# Patient Record
Sex: Female | Born: 1960 | Race: White | Hispanic: No | Marital: Married | Smoking: Former smoker
Health system: Southern US, Community
[De-identification: ages and names within clinical notes are randomized; demographics above are authoritative.]

## PROBLEM LIST (undated history)

## (undated) DIAGNOSIS — L309 Dermatitis, unspecified: Secondary | ICD-10-CM

## (undated) DIAGNOSIS — F32A Depression, unspecified: Secondary | ICD-10-CM

## (undated) DIAGNOSIS — F329 Major depressive disorder, single episode, unspecified: Secondary | ICD-10-CM

## (undated) DIAGNOSIS — R002 Palpitations: Secondary | ICD-10-CM

## (undated) HISTORY — PX: CHOLECYSTECTOMY: SHX55

## (undated) HISTORY — DX: Palpitations: R00.2

## (undated) HISTORY — PX: ANKLE SURGERY: SHX546

## (undated) HISTORY — PX: TUBAL LIGATION: SHX77

## (undated) HISTORY — DX: Depression, unspecified: F32.A

## (undated) HISTORY — DX: Dermatitis, unspecified: L30.9

## (undated) HISTORY — DX: Major depressive disorder, single episode, unspecified: F32.9

---

## 1999-10-15 ENCOUNTER — Ambulatory Visit (HOSPITAL_COMMUNITY): Admission: RE | Admit: 1999-10-15 | Discharge: 1999-10-15 | Payer: Self-pay | Admitting: Pulmonary Disease

## 1999-10-15 ENCOUNTER — Encounter: Payer: Self-pay | Admitting: Pulmonary Disease

## 1999-10-28 ENCOUNTER — Ambulatory Visit (HOSPITAL_COMMUNITY): Admission: RE | Admit: 1999-10-28 | Discharge: 1999-10-29 | Payer: Self-pay | Admitting: General Surgery

## 1999-10-28 ENCOUNTER — Encounter (INDEPENDENT_AMBULATORY_CARE_PROVIDER_SITE_OTHER): Payer: Self-pay | Admitting: *Deleted

## 2006-04-08 ENCOUNTER — Encounter (HOSPITAL_BASED_OUTPATIENT_CLINIC_OR_DEPARTMENT_OTHER): Payer: Self-pay | Admitting: General Surgery

## 2007-10-18 ENCOUNTER — Encounter: Admission: RE | Admit: 2007-10-18 | Discharge: 2007-10-18 | Payer: Self-pay | Admitting: Obstetrics and Gynecology

## 2008-10-20 ENCOUNTER — Encounter: Admission: RE | Admit: 2008-10-20 | Discharge: 2008-10-20 | Payer: Self-pay | Admitting: Obstetrics and Gynecology

## 2009-08-31 ENCOUNTER — Emergency Department (HOSPITAL_BASED_OUTPATIENT_CLINIC_OR_DEPARTMENT_OTHER): Admission: EM | Admit: 2009-08-31 | Discharge: 2009-08-31 | Payer: Self-pay | Admitting: Emergency Medicine

## 2009-11-08 ENCOUNTER — Ambulatory Visit (HOSPITAL_BASED_OUTPATIENT_CLINIC_OR_DEPARTMENT_OTHER): Admission: RE | Admit: 2009-11-08 | Discharge: 2009-11-08 | Payer: Self-pay | Admitting: Obstetrics and Gynecology

## 2009-11-08 ENCOUNTER — Ambulatory Visit: Payer: Self-pay | Admitting: Diagnostic Radiology

## 2010-11-26 ENCOUNTER — Ambulatory Visit (HOSPITAL_BASED_OUTPATIENT_CLINIC_OR_DEPARTMENT_OTHER)
Admission: RE | Admit: 2010-11-26 | Discharge: 2010-11-26 | Payer: Self-pay | Source: Home / Self Care | Attending: Obstetrics and Gynecology | Admitting: Obstetrics and Gynecology

## 2012-01-23 ENCOUNTER — Other Ambulatory Visit: Payer: Self-pay | Admitting: Obstetrics and Gynecology

## 2012-01-23 DIAGNOSIS — Z1231 Encounter for screening mammogram for malignant neoplasm of breast: Secondary | ICD-10-CM

## 2012-01-26 ENCOUNTER — Ambulatory Visit (HOSPITAL_BASED_OUTPATIENT_CLINIC_OR_DEPARTMENT_OTHER)
Admission: RE | Admit: 2012-01-26 | Discharge: 2012-01-26 | Disposition: A | Discharge status: Home / Self Care | Payer: 59 | Source: Ambulatory Visit | Attending: Obstetrics and Gynecology | Admitting: Obstetrics and Gynecology

## 2012-01-26 DIAGNOSIS — Z1231 Encounter for screening mammogram for malignant neoplasm of breast: Secondary | ICD-10-CM

## 2012-06-21 ENCOUNTER — Other Ambulatory Visit (HOSPITAL_COMMUNITY)
Admission: RE | Admit: 2012-06-21 | Discharge: 2012-06-21 | Disposition: A | Discharge status: Home / Self Care | Payer: 59 | Source: Ambulatory Visit | Attending: Obstetrics and Gynecology | Admitting: Obstetrics and Gynecology

## 2012-06-21 DIAGNOSIS — Z124 Encounter for screening for malignant neoplasm of cervix: Secondary | ICD-10-CM | POA: Insufficient documentation

## 2012-10-08 HISTORY — PX: OTHER SURGICAL HISTORY: SHX169

## 2013-03-14 ENCOUNTER — Other Ambulatory Visit: Payer: Self-pay | Admitting: Obstetrics and Gynecology

## 2013-03-14 DIAGNOSIS — Z1231 Encounter for screening mammogram for malignant neoplasm of breast: Secondary | ICD-10-CM

## 2013-03-17 ENCOUNTER — Ambulatory Visit (HOSPITAL_BASED_OUTPATIENT_CLINIC_OR_DEPARTMENT_OTHER)
Admission: RE | Admit: 2013-03-17 | Discharge: 2013-03-17 | Disposition: A | Discharge status: Home / Self Care | Payer: 59 | Source: Ambulatory Visit | Attending: Obstetrics and Gynecology | Admitting: Obstetrics and Gynecology

## 2013-03-17 DIAGNOSIS — Z1231 Encounter for screening mammogram for malignant neoplasm of breast: Secondary | ICD-10-CM | POA: Insufficient documentation

## 2014-02-06 ENCOUNTER — Ambulatory Visit: Payer: 59 | Admitting: Internal Medicine

## 2014-02-07 ENCOUNTER — Encounter: Payer: Self-pay | Admitting: Internal Medicine

## 2014-02-07 ENCOUNTER — Ambulatory Visit (INDEPENDENT_AMBULATORY_CARE_PROVIDER_SITE_OTHER): Payer: 59 | Admitting: Internal Medicine

## 2014-02-07 VITALS — BP 120/90 | HR 69 | Ht 65.0 in | Wt 165.1 lb

## 2014-02-07 DIAGNOSIS — R0609 Other forms of dyspnea: Secondary | ICD-10-CM

## 2014-02-07 DIAGNOSIS — R002 Palpitations: Secondary | ICD-10-CM

## 2014-02-07 DIAGNOSIS — R0989 Other specified symptoms and signs involving the circulatory and respiratory systems: Secondary | ICD-10-CM

## 2014-02-07 DIAGNOSIS — R0600 Dyspnea, unspecified: Secondary | ICD-10-CM

## 2014-02-07 DIAGNOSIS — E785 Hyperlipidemia, unspecified: Secondary | ICD-10-CM

## 2014-02-07 DIAGNOSIS — Z79899 Other long term (current) drug therapy: Secondary | ICD-10-CM

## 2014-02-07 MED ORDER — ATORVASTATIN CALCIUM 20 MG PO TABS: 20.0000 mg | ORAL_TABLET | Freq: Every day | ORAL | Status: DC

## 2014-02-07 MED ORDER — METOPROLOL SUCCINATE ER 25 MG PO TB24: 12.5000 mg | ORAL_TABLET | Freq: Every day | ORAL | Status: DC

## 2014-02-07 NOTE — Patient Instructions (Addendum)
Dr Debara Pickett has ordered a cardiometabolic test - this is done in our office on Mondays. PLEASE TAKE TOPROL THE DAY OF TEST.  What is a Cardiopulmonary Exercise Test (CPET)?   The Cardiopulmonary Exercise Test is a highly sensitive, non-invasive stress test. It is considered a stress test because the exercise stresses your body's systems by making them work faster and harder. A disease or condition that affects the heart, lungs or muscles will limit how much faster and harder these systems can work. A CPET assesses how well the heart, lungs, and muscles are working individually, and how these systems are working in unison. Your heart and lungs work together to deliver oxygen to your muscles, where it is used to make energy, and to remove carbon dioxide from your body.  The full cardiopulmonary system is assessed during a CPET by measuring the amount of oxygen your body is using, the amount of carbon dioxide it is producing, your breathing pattern, and electrocardiogram (EKG) while you are riding a stationary bicycle.  The traditional treadmill stress test only relies on the EKG, which only partially assesses the heart and nothing else. Besides detecting problems in multiple body systems, the CPET is also used to monitor changes in your disease condition, the effect of certain medications on your body, and if medical therapy is improving your condition.  What conditions can be detected/monitored by the Cardiopulmonary ExerciseTest?  Heart, lung, and metabolic conditions may cause shortness of breath, exercise intolerance or discomfort and pain in the chest. The CPET is the only test that can simultaneously determine which of these systems is causing the problem   Your physician has recommended you make the following change in your medication: INCREASE atorvastatin to 65m once daily. START Toprol XL 12.540monce daily.   Your physician recommends that you return for lab work in: 3 months. Fasting -  nothing to eat/drink after midnight.   Please schedule a follow up visit with Dr. HiDebara Pickettfter your MET test.

## 2014-02-07 NOTE — Progress Notes (Signed)
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 Recently has had some fluttering and underwent a 7-day monitor which showed no evidence of extrasystoles. In fact, she had 2 episodes of heart racing which correlated with a sinus rhythm at a rate of about 70. She did have some mild sinus tachycardia and heart rate varied between 50 and 130 over the week of the study. No significant abnormalities were noted. I also had her undergo metabolic testing due to occasional exertional chest pressure. She did very well with that MET-test that was performed on October 18, 2012, showed a normal exercise effort with a peak VO2 of 94%. Max predicted heart rate was slightly higher at 89% with a marked inflection after anaerobic threshold. There was a mild ischemic response with flattening of the VO2 curve; however, this is still a low risk study and I suspect could relate to microvascular dysfunction or some other abnormality at high rates of exercise. Overall, the treatment for this is increasing her exercise. I have given her exercise prescription for better cardiovascular conditioning. One could consider beta blockade; however, I do not think there is a clear indication and her chest fluttering has improved. Finally, we talked about addition of either CoQ10 or L-Arginine to her diet, both of which may be helpful in improving some of her ischemic dysfunction.  Alyssa Warner returns today for followup. She reports that recently she's been exercising and has noted some increasing shortness of breath with exertion. She says that she had cholesterol testing done in January of 2015. I have results of that which indicate total cholesterol 279, triglycerides 57, HDL 70 and LDL 198. Based on this she was started on low-dose Lipitor 10 mg  daily. She denies any true chest pain.  PMHx:  Past Medical History  Diagnosis Date  . Depression     History reviewed. No pertinent past surgical history.  FAMHx:  Family History  Problem Relation Age of Onset  . Breast cancer Mother   . Hypertension Father     SOCHx:   reports that she quit smoking about 30 years ago. She has never used smokeless tobacco. She reports that she drinks alcohol. She reports that she does not use illicit drugs.  ALLERGIES:  No Known Allergies  ROS: A comprehensive review of systems was negative except for: Respiratory: positive for dyspnea on exertion Cardiovascular: positive for palpitations  HOME MEDS: Current Outpatient Prescriptions  Medication Sig Dispense Refill  . atorvastatin (LIPITOR) 20 MG tablet Take 1 tablet (20 mg total) by mouth daily.  30 tablet  6  . CALCIUM-VITAMIN A-VITAMIN D PO Take 1,000 mg by mouth daily.      . CO ENZYME Q-10 PO Take by mouth daily.      Marland Kitchen FLUoxetine (PROZAC) 20 MG capsule Take 20 mg by mouth daily.      . L-ARGININE PO Take by mouth 4 (four) times daily.      . metoprolol succinate (TOPROL-XL) 25 MG 24 hr tablet Take 0.5 tablets (12.5 mg total) by mouth daily.  15 tablet  6   No current facility-administered medications for this visit.    LABS/IMAGING: No results found for this or any previous visit (from the past 48 hour(s)). No results found.  VITALS: BP 120/90  Pulse 69  Ht '5\' 5"'  (1.651 m)  Wt 165 lb 1.6 oz (74.889 kg)  BMI 27.47 kg/m2  EXAM: General appearance: alert and no distress Neck: no carotid bruit and no JVD Lungs: clear to auscultation bilaterally Heart: regular rate and rhythm, S1, S2 normal, no murmur, click, rub or gallop Abdomen: soft, non-tender; bowel sounds normal; no masses,  no organomegaly Extremities: extremities normal, atraumatic, no cyanosis or edema Pulses: 2+ and symmetric Skin: Skin color, texture, turgor normal. No rashes or lesions Neurologic: Grossly  normal Psych: Mood, affect normal  EKG: Normal sinus rhythm at 69  ASSESSMENT: 1. Dyspnea and exertion 2. Palpitations 3. Dyslipidemia-on Lipitor  PLAN: 1.   Alyssa Warner continues to have symptoms that are similar to what she's experienced in the past, including palpitations. Her monitor was fairly unrevealing, but she is known to have had PVCs. I suspect there could be small vessel ischemia causing her abnormal ischemic response with metabolic testing. Overall her last stress test was low risk in 2013. Given her symptoms with the palpitations, would recommend starting low-dose beta blocker 12.5 mg of Toprol-XL daily. I would also recommend a repeat met test while on beta blocker to see how this affects her VO2 curve.  Finally, with respect to her dyslipidemia, her cholesterol target is less than 100 for the LDL and she will not reach that on 10 mg of Lipitor. She needs at least a 50% reduction in her cholesterol. I understand the hesitate approach of using a low-dose statin to try to minimize muscle aches, however it is now recommended that we treat the target. I would therefore increase her Lipitor to 20 mg daily which should get her close to goal if she can make more dietary changes. She should continue on L. arginine and coenzyme Q10 300 mg daily, which may be also helpful in reducing statin myalgias.  Plan to see her back in a few weeks to discuss results of her met test and to see if her symptoms have improved.  Pixie Casino, MD, [facility] Attending Cardiologist [facility]  Alyssa Warner C 02/07/2014, 4:35 PM

## 2014-02-16 ENCOUNTER — Encounter: Payer: Self-pay | Admitting: Internal Medicine

## 2014-02-23 ENCOUNTER — Ambulatory Visit: Payer: 59 | Admitting: Internal Medicine

## 2014-03-02 ENCOUNTER — Encounter: Payer: Self-pay | Admitting: *Deleted

## 2014-03-08 ENCOUNTER — Ambulatory Visit: Payer: 59 | Admitting: Internal Medicine

## 2014-03-13 DIAGNOSIS — R0602 Shortness of breath: Secondary | ICD-10-CM

## 2014-03-13 DIAGNOSIS — R002 Palpitations: Secondary | ICD-10-CM

## 2014-03-13 DIAGNOSIS — R0600 Dyspnea, unspecified: Secondary | ICD-10-CM

## 2014-03-14 ENCOUNTER — Ambulatory Visit: Payer: 59 | Admitting: Internal Medicine

## 2014-04-04 ENCOUNTER — Other Ambulatory Visit: Payer: Self-pay | Admitting: Obstetrics and Gynecology

## 2014-04-04 DIAGNOSIS — Z1231 Encounter for screening mammogram for malignant neoplasm of breast: Secondary | ICD-10-CM

## 2014-04-06 ENCOUNTER — Ambulatory Visit (INDEPENDENT_AMBULATORY_CARE_PROVIDER_SITE_OTHER): Payer: 59 | Admitting: Internal Medicine

## 2014-04-06 ENCOUNTER — Encounter: Payer: Self-pay | Admitting: Internal Medicine

## 2014-04-06 VITALS — BP 98/60 | HR 62 | Ht 65.0 in | Wt 162.3 lb

## 2014-04-06 DIAGNOSIS — E785 Hyperlipidemia, unspecified: Secondary | ICD-10-CM

## 2014-04-06 DIAGNOSIS — R0609 Other forms of dyspnea: Secondary | ICD-10-CM

## 2014-04-06 DIAGNOSIS — R002 Palpitations: Secondary | ICD-10-CM

## 2014-04-06 DIAGNOSIS — R0989 Other specified symptoms and signs involving the circulatory and respiratory systems: Secondary | ICD-10-CM

## 2014-04-06 NOTE — Progress Notes (Signed)
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 Recently has had some fluttering and underwent a 7-day monitor which showed no evidence of extrasystoles. In fact, she had 2 episodes of heart racing which correlated with a sinus rhythm at a rate of about 70. She did have some mild sinus tachycardia and heart rate varied between 50 and 130 over the week of the study. No significant abnormalities were noted. I also had her undergo metabolic testing due to occasional exertional chest pressure. She did very well with that MET-test that was performed on October 18, 2012, showed a normal exercise effort with a peak VO2 of 94%. Max predicted heart rate was slightly higher at 89% with a marked inflection after anaerobic threshold. There was a mild ischemic response with flattening of the VO2 curve; however, this is still a low risk study and I suspect could relate to microvascular dysfunction or some other abnormality at high rates of exercise. Overall, the treatment for this is increasing her exercise. I have given her exercise prescription for better cardiovascular conditioning. One could consider beta blockade; however, I do not think there is a clear indication and her chest fluttering has improved. Finally, we talked about addition of either CoQ10 or L-Arginine to her diet, both of which may be helpful in improving some of her ischemic dysfunction.  Alyssa Warner returns today for followup. She reports that recently she's been exercising and has noted some increasing shortness of breath with exertion. She says that she had cholesterol testing done in January of 2015. I have results of that which indicate total cholesterol 279, triglycerides 57, HDL 70 and LDL 198. Based on this she was started on low-dose Lipitor 10 mg  daily. She denies any true chest pain.  She reported still having problems with palpitations. And there was some shortness of breath with exercise. At her last visit I recommended starting low-dose Toprol. She seems to take that medication without any problems. She underwent repeat metabolic testing. This demonstrated improved exercise tolerance with a stable VO2 of 91%.  PMHx:  Past Medical History  Diagnosis Date  . Depression   . Heart palpitations     Past Surgical History  Procedure Laterality Date  . Met test  10/2012    low risk study; peak VO2 94% predicted; ischemic myocardial dysfunction     FAMHx:  Family History  Problem Relation Age of Onset  . Breast cancer Mother   . Kidney disease Mother   . Hypertension Father   . Arrhythmia Father   . Heart disease Father   . Arrhythmia Brother     pacemaker    SOCHx:   reports that she quit smoking about 30 years ago. She has never used smokeless tobacco. She reports that she drinks alcohol. She reports that she does not use illicit drugs.  ALLERGIES:  No Known Allergies  ROS: A comprehensive review of systems was negative except for: Respiratory: positive for dyspnea on exertion Cardiovascular: positive for palpitations  HOME MEDS: Current Outpatient Prescriptions  Medication Sig Dispense Refill  . atorvastatin (LIPITOR) 20 MG tablet Take 1 tablet (20 mg total) by mouth daily.  30 tablet  6  . CALCIUM-VITAMIN A-VITAMIN D PO Take 1,000 mg by mouth daily.      Marland Kitchen  CO ENZYME Q-10 PO Take by mouth daily.      Marland Kitchen FLUoxetine (PROZAC) 20 MG capsule Take 20 mg by mouth daily.      . L-ARGININE PO Take by mouth 4 (four) times daily.      . metoprolol succinate (TOPROL-XL) 25 MG 24 hr tablet Take 0.5 tablets (12.5 mg total) by mouth daily.  15 tablet  6   No current facility-administered medications for this visit.    LABS/IMAGING: No results found for this or any previous visit (from the past 48 hour(s)). No results  found.  VITALS: BP 98/60  Pulse 62  Ht 5' 5" (1.651 m)  Wt 162 lb 4.8 oz (73.619 kg)  BMI 27.01 kg/m2  EXAM: deferred  EKG: deferred  ASSESSMENT: 1. Dyspnea and exertion - improved performance on MET testing 2. Palpitations - improved 3. Dyslipidemia-on Lipitor  PLAN: 1.   Alyssa Warner has had improvements in her palpitations with low-dose beta blocker. She's also start her walking and I believe this is helping her as well. Her blood pressure is low normal today but she has not been symptomatic with this. I think she still going to be able to tolerate low-dose beta blocker. I recommend continuing her current medications. She's due for recheck of her cholesterol to see if she's had improvement in her dyslipidemia on Lipitor sometime in June. I'll contact her with the results of that study. Otherwise plan to see her back in 6 months to year.  Pixie Casino, MD, [facility] Attending Cardiologist [place] 04/06/2014, 5:21 PM

## 2014-04-06 NOTE — Patient Instructions (Signed)
Your physician recommends that you return for lab work in June - fasting.   Your physician wants you to follow-up in: 6 months with Dr. Rennis GoldenHilty. You will receive a reminder letter in the mail two months in advance. If you don't receive a letter, please call our office to schedule the follow-up appointment.

## 2014-04-10 ENCOUNTER — Ambulatory Visit (HOSPITAL_BASED_OUTPATIENT_CLINIC_OR_DEPARTMENT_OTHER)
Admission: RE | Admit: 2014-04-10 | Discharge: 2014-04-10 | Disposition: A | Discharge status: Home / Self Care | Payer: 59 | Source: Ambulatory Visit | Attending: Obstetrics and Gynecology | Admitting: Obstetrics and Gynecology

## 2014-04-10 DIAGNOSIS — Z1231 Encounter for screening mammogram for malignant neoplasm of breast: Secondary | ICD-10-CM

## 2014-04-11 ENCOUNTER — Other Ambulatory Visit: Payer: Self-pay | Admitting: *Deleted

## 2014-04-11 MED ORDER — ATORVASTATIN CALCIUM 20 MG PO TABS: 20.0000 mg | ORAL_TABLET | Freq: Every day | ORAL | Status: DC

## 2014-04-11 NOTE — Telephone Encounter (Signed)
Rx refill sent to patient pharmacy for a 90 day supply in order for coverage.

## 2014-05-10 ENCOUNTER — Other Ambulatory Visit: Payer: Self-pay

## 2014-05-10 MED ORDER — METOPROLOL SUCCINATE ER 25 MG PO TB24: 12.5000 mg | ORAL_TABLET | Freq: Every day | ORAL | Status: DC

## 2014-05-10 NOTE — Telephone Encounter (Signed)
Rx was sent to pharmacy electronically. 

## 2014-05-16 ENCOUNTER — Telehealth: Payer: Self-pay | Admitting: Internal Medicine

## 2014-05-16 LAB — COMPREHENSIVE METABOLIC PANEL
ALBUMIN: 4.3 g/dL (ref 3.5–5.2)
ALT: 21 U/L (ref 0–35)
AST: 20 U/L (ref 0–37)
Alkaline Phosphatase: 71 U/L (ref 39–117)
BUN: 15 mg/dL (ref 6–23)
CALCIUM: 9.3 mg/dL (ref 8.4–10.5)
CO2: 28 mEq/L (ref 19–32)
Chloride: 101 mEq/L (ref 96–112)
Creat: 0.85 mg/dL (ref 0.50–1.10)
Glucose, Bld: 87 mg/dL (ref 70–99)
POTASSIUM: 4.2 meq/L (ref 3.5–5.3)
Sodium: 139 mEq/L (ref 135–145)
TOTAL PROTEIN: 7.1 g/dL (ref 6.0–8.3)
Total Bilirubin: 0.6 mg/dL (ref 0.2–1.2)

## 2014-05-16 NOTE — Telephone Encounter (Signed)
RN returned call to patient. Advised her of where lab is and that she did not need the physical lab papers to take to get blood work. She will get labs today.

## 2014-05-16 NOTE — Telephone Encounter (Signed)
Pt lost her lab order. Would you please fax it to [phone] and also what lab does she need to go?

## 2014-05-17 ENCOUNTER — Encounter: Payer: Self-pay | Admitting: *Deleted

## 2014-05-17 LAB — NMR LIPOPROFILE WITH LIPIDS
Cholesterol, Total: 169 mg/dL (ref <200)
HDL Particle Number: 40.6 umol/L (ref >=30.5)
HDL Size: 9.8 nm (ref >=9.2)
HDL-C: 75 mg/dL (ref >=40)
LDL (calc): 76 mg/dL (ref <100)
LDL PARTICLE NUMBER: 647 nmol/L (ref <1000)
LDL Size: 21.8 nm (ref >20.5)
LP-IR Score: 27 (ref <=45)
Large HDL-P: 13.1 umol/L (ref >=4.8)
Large VLDL-P: 3.6 nmol/L — ABNORMAL HIGH (ref <=2.7)
Small LDL Particle Number: 199 nmol/L (ref <=527)
TRIGLYCERIDES: 92 mg/dL (ref <150)
VLDL SIZE: 48.3 nm — AB (ref <=46.6)

## 2015-01-23 ENCOUNTER — Ambulatory Visit: Payer: 59 | Admitting: Internal Medicine

## 2015-04-02 ENCOUNTER — Ambulatory Visit: Payer: 59 | Admitting: Internal Medicine

## 2015-05-10 ENCOUNTER — Encounter: Payer: Self-pay | Admitting: Internal Medicine

## 2015-05-10 ENCOUNTER — Ambulatory Visit (INDEPENDENT_AMBULATORY_CARE_PROVIDER_SITE_OTHER): Payer: 59 | Admitting: Internal Medicine

## 2015-05-10 VITALS — BP 90/64 | HR 52 | Ht 65.0 in | Wt 155.8 lb

## 2015-05-10 DIAGNOSIS — Z79899 Other long term (current) drug therapy: Secondary | ICD-10-CM

## 2015-05-10 DIAGNOSIS — R002 Palpitations: Secondary | ICD-10-CM | POA: Diagnosis not present

## 2015-05-10 DIAGNOSIS — E785 Hyperlipidemia, unspecified: Secondary | ICD-10-CM | POA: Diagnosis not present

## 2015-05-10 NOTE — Patient Instructions (Signed)
Your physician recommends that you return for lab work FASTING  Your physician wants you to follow-up in: 1 year with Dr. Hilty. You will receive a reminder letter in the mail two months in advance. If you don't receive a letter, please call our office to schedule the follow-up appointment.  

## 2015-05-10 NOTE — Progress Notes (Signed)
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 Recently has had some fluttering and underwent a 7-day monitor which showed no evidence of extrasystoles. In fact, she had 2 episodes of heart racing which correlated with a sinus rhythm at a rate of about 70. She did have some mild sinus tachycardia and heart rate varied between 50 and 130 over the week of the study. No significant abnormalities were noted. I also had her undergo metabolic testing due to occasional exertional chest pressure. She did very well with that MET-test that was performed on October 18, 2012, showed a normal exercise effort with a peak VO2 of 94%. Max predicted heart rate was slightly higher at 89% with a marked inflection after anaerobic threshold. There was a mild ischemic response with flattening of the VO2 curve; however, this is still a low risk study and I suspect could relate to microvascular dysfunction or some other abnormality at high rates of exercise. Overall, the treatment for this is increasing her exercise. I have given her exercise prescription for better cardiovascular conditioning. One could consider beta blockade; however, I do not think there is a clear indication and her chest fluttering has improved. Finally, we talked about addition of either CoQ10 or L-Arginine to her diet, both of which may be helpful in improving some of her ischemic dysfunction.  Alyssa Warner returns today for followup. She reports that recently she's been exercising and has noted some increasing shortness of breath with exertion. She says that she had cholesterol testing done in January of 2015. I have results of that which indicate total cholesterol 279, triglycerides 57, HDL 70 and LDL 198. Based on this she was started on low-dose Lipitor 10 mg daily.  She denies any true chest pain.  She reported still having problems with palpitations. And there was some shortness of breath with exercise. At her last visit I recommended starting low-dose Toprol. She seems to take that medication without any problems. She underwent repeat metabolic testing. This demonstrated improved exercise tolerance with a stable VO2 of 91%.  Alyssa Warner returns today for follow-up. Overall she is doing well. She denies any significant shortness of breath. She very occasionally gets palpitations but feels like they're well controlled on her beta blocker. Blood pressure is low today at 90/60 however she is asymptomatic. She says she rarely checks heart rate or blood pressure. She denies any presyncope, worsening fatigue or other symptoms and may be associated with low blood pressure.  PMHx:  Past Medical History  Diagnosis Date  . Depression   . Heart palpitations     Past Surgical History  Procedure Laterality Date  . Met test  10/2012    low risk study; peak VO2 94% predicted; ischemic myocardial dysfunction     FAMHx:  Family History  Problem Relation Age of Onset  . Breast cancer Mother   . Kidney disease Mother   . Hypertension Father   . Arrhythmia Father   . Heart disease Father   . Arrhythmia Brother     pacemaker    SOCHx:   reports that she quit smoking about 31 years ago. She has never used smokeless tobacco. She reports that she drinks alcohol. She reports that she does not use illicit drugs.  ALLERGIES:  No Known  Allergies  ROS: A comprehensive review of systems was negative.  HOME MEDS: Current Outpatient Prescriptions  Medication Sig Dispense Refill  . atorvastatin (LIPITOR) 20 MG tablet Take 1 tablet (20 mg total) by mouth daily. 90 tablet 2  . CALCIUM-VITAMIN A-VITAMIN D PO Take 1,000 mg by mouth daily.    . CO ENZYME Q-10 PO Take by mouth daily.    Marland Kitchen FLUoxetine (PROZAC) 20 MG capsule Take 20 mg by mouth daily.    . L-ARGININE PO  Take by mouth 4 (four) times daily.    . metoprolol succinate (TOPROL-XL) 25 MG 24 hr tablet Take 0.5 tablets (12.5 mg total) by mouth daily. 45 tablet 3   No current facility-administered medications for this visit.    LABS/IMAGING: No results found for this or any previous visit (from the past 48 hour(s)). No results found.  VITALS: BP 90/64 mmHg  Pulse 52  Ht _0  (1.651 m)  Wt 155 lb 12.8 oz (70.67 kg)  BMI 25.93 kg/m2  EXAM: General appearance: alert and no distress Neck: no carotid bruit and no JVD Lungs: clear to auscultation bilaterally Heart: regular rate and rhythm, S1, S2 normal, no murmur, click, rub or gallop Abdomen: soft, non-tender; bowel sounds normal; no masses,  no organomegaly Extremities: extremities normal, atraumatic, no cyanosis or edema Pulses: 2+ and symmetric Skin: Skin color, texture, turgor normal. No rashes or lesions Neurologic: Grossly normal Psych: NOrmal  EKG: Sinus bradycardia 52  ASSESSMENT: 1. Dyspnea and exertion - improved performance on MET testing 2. Palpitations - improved 3. Dyslipidemia-on Lipitor  PLAN: 1.   Alyssa Warner feels very well denies chest pain or shortness of breath. She remains physically active. She has very infrequent palpitations which are controlled on the Toprol. Blood pressure is a low normal today. I recommended she could liberalize salt in her diet. She is due for recheck of cholesterol issues on Lipitor. We'll go ahead and send her for a regular lipid profile. I'll contact her with those results, otherwise plan to see her back annually or sooner as necessary.  Pixie Casino, MD, [facility] Attending Cardiologist [place] 05/10/2015, 11:11 AM

## 2015-05-14 ENCOUNTER — Telehealth: Payer: Self-pay | Admitting: Internal Medicine

## 2015-05-14 MED ORDER — FLUOXETINE HCL 20 MG PO CAPS: 20.0000 mg | ORAL_CAPSULE | Freq: Every day | ORAL | Status: DC

## 2015-05-14 NOTE — Telephone Encounter (Signed)
Message routed to Dr. Rennis GoldenHilty to see if he will refill this medication for short supply

## 2015-05-14 NOTE — Telephone Encounter (Signed)
Rx(s) sent to pharmacy electronically. LM on cell that med was refilled

## 2015-05-14 NOTE — Telephone Encounter (Signed)
°  1. Which medications need to be refilled? Fluoxetine 20mg  1 tab po qd  2. Which pharmacy is medication to be sent to?[facility] on [address]  3. Do they need a 30 day or 90 day supply? 30  4. Would they like a call back once the medication has been sent to the pharmacy? Yes  * SHE STATES THAT HE GYNECOLOGIST ORIGINALLY WROTE THE PRESCRIPTION FOR THIS MEDICATION BUT SHE IS UNABLE TO LOCATE HER DOCTOR NOW AND CAN NOT GET ANOTHER APPT WITH A NEW DOCTOR FOR ANOTHER 2 WKS.

## 2015-05-14 NOTE — Telephone Encounter (Signed)
Ok to refill short supply, no-refills.  DR. HRexene Edison

## 2015-05-23 ENCOUNTER — Encounter: Payer: Self-pay | Admitting: *Deleted

## 2015-05-23 LAB — COMPREHENSIVE METABOLIC PANEL
ALBUMIN: 4 g/dL (ref 3.5–5.2)
ALT: 19 U/L (ref 0–35)
AST: 20 U/L (ref 0–37)
Alkaline Phosphatase: 83 U/L (ref 39–117)
BUN: 15 mg/dL (ref 6–23)
CALCIUM: 9.1 mg/dL (ref 8.4–10.5)
CHLORIDE: 105 meq/L (ref 96–112)
CO2: 30 mEq/L (ref 19–32)
Creat: 0.79 mg/dL (ref 0.50–1.10)
GLUCOSE: 88 mg/dL (ref 70–99)
Potassium: 4.3 mEq/L (ref 3.5–5.3)
Sodium: 142 mEq/L (ref 135–145)
Total Bilirubin: 0.5 mg/dL (ref 0.2–1.2)
Total Protein: 6.5 g/dL (ref 6.0–8.3)

## 2015-05-23 LAB — LIPID PANEL
CHOLESTEROL: 146 mg/dL (ref 0–200)
HDL: 72 mg/dL (ref >=46)
LDL CALC: 63 mg/dL (ref 0–99)
TRIGLYCERIDES: 55 mg/dL (ref <150)
Total CHOL/HDL Ratio: 2 Ratio
VLDL: 11 mg/dL (ref 0–40)

## 2015-08-08 ENCOUNTER — Other Ambulatory Visit: Payer: Self-pay | Admitting: Internal Medicine

## 2015-08-08 NOTE — Telephone Encounter (Signed)
Rx(s) sent to pharmacy electronically.  

## 2015-10-18 ENCOUNTER — Telehealth: Payer: Self-pay | Admitting: Internal Medicine

## 2015-10-18 MED ORDER — ATORVASTATIN CALCIUM 20 MG PO TABS: 20.0000 mg | ORAL_TABLET | Freq: Every day | ORAL | Status: DC

## 2015-10-18 NOTE — Telephone Encounter (Signed)
°*  STAT* If patient is at the pharmacy, call can be transferred to refill team.   1. Which medications need to be refilled? (please list name of each medication and dose if known) Atorvastatin  2. Which pharmacy/location (including street and city if local pharmacy) is medication to [facility] sent to?[phone]  3. Do they need a 30 day or 90 day supply? 90 and refills

## 2016-07-14 ENCOUNTER — Ambulatory Visit: Payer: 59 | Admitting: Internal Medicine

## 2016-07-16 ENCOUNTER — Ambulatory Visit: Payer: 59 | Admitting: Internal Medicine

## 2016-07-24 ENCOUNTER — Ambulatory Visit (INDEPENDENT_AMBULATORY_CARE_PROVIDER_SITE_OTHER): Payer: 59 | Admitting: Internal Medicine

## 2016-07-24 ENCOUNTER — Encounter: Payer: Self-pay | Admitting: Internal Medicine

## 2016-07-24 VITALS — BP 122/71 | HR 63 | Ht 65.0 in | Wt 155.4 lb

## 2016-07-24 DIAGNOSIS — E785 Hyperlipidemia, unspecified: Secondary | ICD-10-CM | POA: Diagnosis not present

## 2016-07-24 DIAGNOSIS — R0600 Dyspnea, unspecified: Secondary | ICD-10-CM | POA: Diagnosis not present

## 2016-07-24 DIAGNOSIS — R002 Palpitations: Secondary | ICD-10-CM

## 2016-07-24 NOTE — Patient Instructions (Signed)
Your physician recommends that you return for lab work FASTING to check cholesterol   Your physician wants you to follow-up in: ONE YEAR with Dr. Hilty. You will receive a reminder letter in the mail two months in advance. If you don't receive a letter, please call our office to schedule the follow-up appointment.   

## 2016-07-24 NOTE — Progress Notes (Signed)
OFFICE NOTE  Chief Complaint:  Rare palpitations  Primary Care Physician: Oliver Pila, MD  HPI:  Alyssa Warner is a 55 year-old female who was sent to me for palpitations and was found to have some PVCs many years ago. Recently has had some fluttering and underwent a 7-day monitor which showed no evidence of extrasystoles. In fact, she had 2 episodes of heart racing which correlated with a sinus rhythm at a rate of about 70. She did have some mild sinus tachycardia and heart rate varied between 50 and 130 over the week of the study. No significant abnormalities were noted. I also had her undergo metabolic testing due to occasional exertional chest pressure. She did very well with that MET-test that was performed on October 18, 2012, showed a normal exercise effort with a peak VO2 of 94%. Max predicted heart rate was slightly higher at 89% with a marked inflection after anaerobic threshold. There was a mild ischemic response with flattening of the VO2 curve; however, this is still a low risk study and I suspect could relate to microvascular dysfunction or some other abnormality at high rates of exercise. Overall, the treatment for this is increasing her exercise. I have given her exercise prescription for better cardiovascular conditioning. One could consider beta blockade; however, I do not think there is a clear indication and her chest fluttering has improved. Finally, we talked about addition of either CoQ10 or L-Arginine to her diet, both of which may be helpful in improving some of her ischemic dysfunction.  Alyssa Warner returns today for followup. She reports that recently she's been exercising and has noted some increasing shortness of breath with exertion. She says that she had cholesterol testing done in January of 2015. I have results of that which indicate total cholesterol 279, triglycerides 57, HDL 70 and LDL 198. Based on this she was started on low-dose Lipitor 10 mg  daily. She denies any true chest pain.  She reported still having problems with palpitations. And there was some shortness of breath with exercise. At her last visit I recommended starting low-dose Toprol. She seems to take that medication without any problems. She underwent repeat metabolic testing. This demonstrated improved exercise tolerance with a stable VO2 of 91%.  Alyssa Warner returns today for follow-up. Overall she is doing well. She denies any significant shortness of breath. She very occasionally gets palpitations but feels like they're well controlled on her beta blocker. Blood pressure is low today at 90/60 however she is asymptomatic. She says she rarely checks heart rate or blood pressure. She denies any presyncope, worsening fatigue or other symptoms and may be associated with low blood pressure.  07/24/2016  Alyssa Warner returns today for follow-up. She reports very infrequent palpitations that are generally well controlled. She denies any worsening shortness of breath with exertion. She has recently bought a bicycle and hurt her husband exercise regularly. They also have a beach house which they are going to be going to more regularly as well. Cholesterol is due for reassessment but was well controlled last year. She denies any chest pain.  PMHx:  Past Medical History:  Diagnosis Date  . Depression   . Heart palpitations     Past Surgical History:  Procedure Laterality Date  . MET Test  10/2012   low risk study; peak VO2 94% predicted; ischemic myocardial dysfunction     FAMHx:  Family History  Problem Relation Age of Onset  . Breast cancer Mother   .  Kidney disease Mother   . Hypertension Father   . Arrhythmia Father   . Heart disease Father   . Arrhythmia Brother     pacemaker    SOCHx:   reports that she quit smoking about 32 years ago. She has never used smokeless tobacco. She reports that she drinks alcohol. She reports that she does not use  drugs.  ALLERGIES:  No Known Allergies  ROS: Pertinent items noted in HPI and remainder of comprehensive ROS otherwise negative.  HOME MEDS: Current Outpatient Prescriptions  Medication Sig Dispense Refill  . atorvastatin (LIPITOR) 20 MG tablet Take 1 tablet (20 mg total) by mouth daily. 90 tablet 2  . CALCIUM-VITAMIN A-VITAMIN D PO Take 1,000 mg by mouth daily.    . CO ENZYME Q-10 PO Take by mouth daily.    Marland Kitchen FLUoxetine (PROZAC) 20 MG capsule Take 1 capsule (20 mg total) by mouth daily. 30 capsule 0  . L-ARGININE PO Take by mouth 4 (four) times daily.    . metoprolol succinate (TOPROL-XL) 25 MG 24 hr tablet TAKE 0.5 TABLETS (12.5 MG TOTAL) BY MOUTH DAILY. 45 tablet 2   No current facility-administered medications for this visit.     LABS/IMAGING: No results found for this or any previous visit (from the past 48 hour(s)). No results found.  VITALS: BP 122/71   Pulse 63   Ht '5\' 5"'  (1.651 m)   Wt 155 lb 6.4 oz (70.5 kg)   BMI 25.86 kg/m   EXAM: General appearance: alert and no distress Neck: no carotid bruit and no JVD Lungs: clear to auscultation bilaterally Heart: regular rate and rhythm, S1, S2 normal, no murmur, click, rub or gallop Abdomen: soft, non-tender; bowel sounds normal; no masses,  no organomegaly Extremities: extremities normal, atraumatic, no cyanosis or edema Pulses: 2+ and symmetric Skin: Skin color, texture, turgor normal. No rashes or lesions Neurologic: Grossly normal Psych: Normal  EKG: Normal sinus rhythm at 63  ASSESSMENT: 1. Dyspnea and exertion - improved 2. Palpitations - improved 3. Dyslipidemia-on Lipitor  PLAN: 1.   Mrs. Schiavi reports very rare palpitations which are well controlled. We'll continue her current dose of Toprol. She is due for repeat check of her cholesterol and I'll order that today. Her shortness of breath has improved and seems to be improving with more exercise. Plan to see her back annually or sooner as  necessary.  Pixie Casino, MD, [facility] Attending Cardiologist [place] 07/24/2016, 12:45 PM

## 2016-11-19 LAB — LIPID PANEL
Cholesterol: 202 mg/dL — ABNORMAL HIGH (ref <200)
HDL: 81 mg/dL (ref >50)
LDL CALC: 109 mg/dL — AB (ref <100)
TRIGLYCERIDES: 59 mg/dL (ref <150)
Total CHOL/HDL Ratio: 2.5 Ratio (ref <5.0)
VLDL: 12 mg/dL (ref <30)

## 2016-11-21 ENCOUNTER — Encounter: Payer: Self-pay | Admitting: Internal Medicine

## 2016-11-21 ENCOUNTER — Telehealth: Payer: Self-pay | Admitting: Internal Medicine

## 2016-11-21 DIAGNOSIS — E785 Hyperlipidemia, unspecified: Secondary | ICD-10-CM

## 2016-11-21 NOTE — Telephone Encounter (Signed)
New message ° °Pt is returning call  ° °Please call back °

## 2016-11-21 NOTE — Telephone Encounter (Signed)
Called patient and notified her of lab results. She had not been taking lipitor 20mg  on a regular basis. She states she will be more diligent about this. She will have repeat labs in 3 months. Lab slips, copy of labs, mychart sign up info mailed to patient.

## 2017-01-26 ENCOUNTER — Emergency Department (HOSPITAL_BASED_OUTPATIENT_CLINIC_OR_DEPARTMENT_OTHER)
Admission: EM | Admit: 2017-01-26 | Discharge: 2017-01-26 | Disposition: A | Discharge status: Home / Self Care | Payer: 59 | Attending: Emergency Medicine | Admitting: Emergency Medicine

## 2017-01-26 ENCOUNTER — Encounter (HOSPITAL_BASED_OUTPATIENT_CLINIC_OR_DEPARTMENT_OTHER): Payer: Self-pay | Admitting: Emergency Medicine

## 2017-01-26 ENCOUNTER — Emergency Department (HOSPITAL_BASED_OUTPATIENT_CLINIC_OR_DEPARTMENT_OTHER): Payer: 59

## 2017-01-26 DIAGNOSIS — Z5181 Encounter for therapeutic drug level monitoring: Secondary | ICD-10-CM | POA: Insufficient documentation

## 2017-01-26 DIAGNOSIS — R519 Headache, unspecified: Secondary | ICD-10-CM

## 2017-01-26 DIAGNOSIS — Z87891 Personal history of nicotine dependence: Secondary | ICD-10-CM | POA: Diagnosis not present

## 2017-01-26 DIAGNOSIS — R51 Headache: Secondary | ICD-10-CM | POA: Insufficient documentation

## 2017-01-26 DIAGNOSIS — Z79899 Other long term (current) drug therapy: Secondary | ICD-10-CM | POA: Diagnosis not present

## 2017-01-26 LAB — COMPREHENSIVE METABOLIC PANEL
ALBUMIN: 4.2 g/dL (ref 3.5–5.0)
ALT: 27 U/L (ref 14–54)
ANION GAP: 8 (ref 5–15)
AST: 28 U/L (ref 15–41)
Alkaline Phosphatase: 73 U/L (ref 38–126)
BILIRUBIN TOTAL: 0.6 mg/dL (ref 0.3–1.2)
BUN: 15 mg/dL (ref 6–20)
CO2: 28 mmol/L (ref 22–32)
Calcium: 9.6 mg/dL (ref 8.9–10.3)
Chloride: 103 mmol/L (ref 101–111)
Creatinine, Ser: 0.77 mg/dL (ref 0.44–1.00)
GFR calc non Af Amer: >60 mL/min (ref >60)
GLUCOSE: 113 mg/dL — AB (ref 65–99)
POTASSIUM: 3.4 mmol/L — AB (ref 3.5–5.1)
SODIUM: 139 mmol/L (ref 135–145)
TOTAL PROTEIN: 7.5 g/dL (ref 6.5–8.1)

## 2017-01-26 LAB — DIFFERENTIAL
Basophils Absolute: 0 10*3/uL (ref 0.0–0.1)
Basophils Relative: 0 %
EOS ABS: 0 10*3/uL (ref 0.0–0.7)
EOS PCT: 1 %
Lymphocytes Relative: 15 %
Lymphs Abs: 1.2 10*3/uL (ref 0.7–4.0)
Monocytes Absolute: 0.5 10*3/uL (ref 0.1–1.0)
Monocytes Relative: 6 %
NEUTROS PCT: 78 %
Neutro Abs: 6.1 10*3/uL (ref 1.7–7.7)

## 2017-01-26 LAB — CBC
HCT: 40.9 % (ref 36.0–46.0)
Hemoglobin: 13.8 g/dL (ref 12.0–15.0)
MCH: 29 pg (ref 26.0–34.0)
MCHC: 33.7 g/dL (ref 30.0–36.0)
MCV: 85.9 fL (ref 78.0–100.0)
PLATELETS: 268 10*3/uL (ref 150–400)
RBC: 4.76 MIL/uL (ref 3.87–5.11)
RDW: 12.4 % (ref 11.5–15.5)
WBC: 7.8 10*3/uL (ref 4.0–10.5)

## 2017-01-26 LAB — URINALYSIS, ROUTINE W REFLEX MICROSCOPIC
BILIRUBIN URINE: NEGATIVE
GLUCOSE, UA: NEGATIVE mg/dL
Ketones, ur: NEGATIVE mg/dL
Nitrite: NEGATIVE
PH: 7 (ref 5.0–8.0)
Protein, ur: NEGATIVE mg/dL
SPECIFIC GRAVITY, URINE: 1.005 (ref 1.005–1.030)

## 2017-01-26 LAB — URINALYSIS, MICROSCOPIC (REFLEX)

## 2017-01-26 LAB — RAPID URINE DRUG SCREEN, HOSP PERFORMED
AMPHETAMINES: NOT DETECTED
BENZODIAZEPINES: NOT DETECTED
Barbiturates: NOT DETECTED
Cocaine: NOT DETECTED
Opiates: NOT DETECTED
Tetrahydrocannabinol: NOT DETECTED

## 2017-01-26 LAB — APTT: aPTT: 31 seconds (ref 24–36)

## 2017-01-26 LAB — PROTIME-INR
INR: 0.92
PROTHROMBIN TIME: 12.3 s (ref 11.4–15.2)

## 2017-01-26 LAB — TROPONIN I

## 2017-01-26 LAB — ETHANOL

## 2017-01-26 MED ORDER — PROCHLORPERAZINE EDISYLATE 5 MG/ML IJ SOLN
5.0000 mg | Freq: Once | INTRAMUSCULAR | Status: AC
  Administered 2017-01-26: 5 mg via INTRAVENOUS
  Filled 2017-01-26: qty 2

## 2017-01-26 MED ORDER — DIPHENHYDRAMINE HCL 50 MG/ML IJ SOLN
12.5000 mg | Freq: Once | INTRAMUSCULAR | Status: AC
  Administered 2017-01-26: 12.5 mg via INTRAVENOUS
  Filled 2017-01-26: qty 1

## 2017-01-26 MED ORDER — KETOROLAC TROMETHAMINE 30 MG/ML IJ SOLN
15.0000 mg | Freq: Once | INTRAMUSCULAR | Status: AC
  Administered 2017-01-26: 15 mg via INTRAVENOUS
  Filled 2017-01-26: qty 1

## 2017-01-26 NOTE — Discharge Instructions (Signed)

## 2017-01-26 NOTE — ED Triage Notes (Signed)
Pt states new onset of HA started about 15 mins ago, no Hx of HA.  A/o x 4, nero intact. States tingling in face.

## 2017-01-26 NOTE — ED Provider Notes (Signed)
[facility] DEPT [facility] Provider Note   CSN: [id number] Arrival date & time: 01/26/17  1507  By signing my name below, I, Arianna Nassar, attest that this documentation has been prepared under the direction and in the presence of Margarita Mail, PA-C.  Electronically Signed: Julien Nordmann, ED Scribe. 01/26/17. 5:17 PM.    History   Chief Complaint Chief Complaint  Patient presents with  . Headache   The history is provided by the patient. No language interpreter was used.   HPI Comments: Alyssa Warner is a 56 y.o. female who has a PMhx of high cholesterol presents to the Emergency Department complaining of an acute onset, severe, occipital headache that began about 2.5 hours ago. She rates her current 6/10 but notes it was an 8/10 at its peak. Pt says the pain radiated to the front of her head which she notes was more severe than the posterior headache. She reports associated photophobia, osmophobia, bilateral arm numbness and tingling to her right face which she states has resolved. Pt says that she was on the phone conversing about a stressful situation when she suddenly felt a posterior headache. She had her blood pressure checked and reports it was 190/70. Pt has not had similar symptoms in the past. Pt is able to ambulate without difficulty. She takes lipitor and prozac daily and is compliant. Pt has no hx of migraines or DM. Pt denies dizziness, nausea, facial droop, visual disturbances, and phonophobia.  Past Medical History:  Diagnosis Date  . Depression   . Heart palpitations     Patient Active Problem List   Diagnosis Date Noted  . Dyspnea 02/07/2014  . Palpitations 02/07/2014  . Dyslipidemia 02/07/2014    Past Surgical History:  Procedure Laterality Date  . MET Test  10/2012   low risk study; peak VO2 94% predicted; ischemic myocardial dysfunction     OB History    No data available       Home Medications    Prior to Admission medications   Medication  Sig Start Date End Date Taking? Authorizing Provider  hydrocortisone cream 1 % Apply 1 application topically 2 (two) times daily.   Yes Historical Provider, MD  Methylcobalamin (B12-ACTIVE PO) Take by mouth.   Yes Historical Provider, MD  atorvastatin (LIPITOR) 20 MG tablet Take 1 tablet (20 mg total) by mouth daily. 10/18/15   Pixie Casino, MD  CALCIUM-VITAMIN A-VITAMIN D PO Take 1,000 mg by mouth daily.    Historical Provider, MD  CO ENZYME Q-10 PO Take by mouth daily.    Historical Provider, MD  FLUoxetine (PROZAC) 20 MG capsule Take 1 capsule (20 mg total) by mouth daily. 05/14/15   Pixie Casino, MD  L-ARGININE PO Take by mouth 4 (four) times daily.    Historical Provider, MD  metoprolol succinate (TOPROL-XL) 25 MG 24 hr tablet TAKE 0.5 TABLETS (12.5 MG TOTAL) BY MOUTH DAILY. 08/08/15   Pixie Casino, MD    Family History Family History  Problem Relation Age of Onset  . Breast cancer Mother   . Kidney disease Mother   . Hypertension Father   . Arrhythmia Father   . Heart disease Father   . Arrhythmia Brother     pacemaker    Social History Social History  Substance Use Topics  . Smoking status: Former Smoker    Quit date: 02/08/1984  . Smokeless tobacco: Never Used  . Alcohol use Yes     Comment: socially  Allergies   Patient has no known allergies.   Review of Systems Review of Systems  A complete 10 system review of systems was obtained and all systems are negative except as noted in the HPI and PMH.   Physical Exam Updated Vital Signs BP 187/78   Pulse 95   Temp 97.7 F (36.5 C) (Oral)   Resp 18   Ht '5\' 5"'  (1.651 m)   Wt 146 lb (66.2 kg)   SpO2 100%   BMI 24.30 kg/m   Physical Exam  Constitutional: She is oriented to person, place, and time. She appears well-developed and well-nourished. No distress.  HENT:  Head: Normocephalic and atraumatic.  Eyes: EOM are normal.  Neck: Normal range of motion.  Cardiovascular: Normal rate, regular rhythm  and normal heart sounds.   Pulmonary/Chest: Effort normal and breath sounds normal.  Abdominal: Soft. She exhibits no distension. There is no tenderness.  Musculoskeletal: Normal range of motion.  Neurological: She is alert and oriented to person, place, and time. She displays normal reflexes. No cranial nerve deficit or sensory deficit. She exhibits normal muscle tone. Coordination normal.  Skin: Skin is warm and dry.  Psychiatric: She has a normal mood and affect. Judgment normal.  Nursing note and vitals reviewed.    ED Treatments / Results  DIAGNOSTIC STUDIES: Oxygen Saturation is 100% on RA, normal by my interpretation.  COORDINATION OF CARE:  5:13 PM Discussed treatment plan with pt at bedside and pt agreed to plan.  Labs (all labs ordered are listed, but only abnormal results are displayed) Labs Reviewed - No data to display  EKG  EKG Interpretation None       Radiology No results found.  Procedures Procedures (including critical care time)  Medications Ordered in ED Medications - No data to display   Initial Impression / Assessment and Plan / ED Course  I have reviewed the triage vital signs and the nursing notes.  Pertinent labs & imaging results that were available during my care of the patient were reviewed by me and considered in my medical decision making (see chart for details).      Patient with headache. CT scan is negative for acute abnormality. There is some noted atherosclerosis, however, no sign of subarachnoid hemorrhage. Patient does not have any neurologic symptoms at this time. I discussed the case with Dr. Lita Mains. She was transiently hypertensive upon arrival. This is when her symptoms were present and her headache was at its worst. I suspect her hypertension was related to stress and pain. Also believe that her neurologic symptoms were likely secondary to her transient hypertension. I discussed the case with Dr. Lita Mains and specifically he  does not feel the patient needs inpatient admission for TIA. I have recommended that the patient follow up for OP carotid dopplers and echocardiogram.  Patient's headache is fully resolved. She has no neurologic deficits and appars safe for discharge at this time.  Final Clinical Impressions(s) / ED Diagnoses   Final diagnoses:  None   I personally performed the services described in this documentation, which was scribed in my presence. The recorded information has been reviewed and is accurate.     New Prescriptions New Prescriptions   No medications on file     Margarita Mail, PA-C 01/26/17 1919    Julianne Rice, MD 01/27/17 [phone]

## 2017-01-26 NOTE — ED Notes (Signed)
Patient transported to CT 

## 2017-02-03 ENCOUNTER — Ambulatory Visit (INDEPENDENT_AMBULATORY_CARE_PROVIDER_SITE_OTHER): Payer: 59 | Admitting: Internal Medicine

## 2017-02-03 ENCOUNTER — Encounter: Payer: Self-pay | Admitting: Internal Medicine

## 2017-02-03 VITALS — BP 112/64 | HR 60 | Ht 65.0 in | Wt 157.0 lb

## 2017-02-03 DIAGNOSIS — G44209 Tension-type headache, unspecified, not intractable: Secondary | ICD-10-CM | POA: Insufficient documentation

## 2017-02-03 DIAGNOSIS — E785 Hyperlipidemia, unspecified: Secondary | ICD-10-CM | POA: Diagnosis not present

## 2017-02-03 DIAGNOSIS — R002 Palpitations: Secondary | ICD-10-CM

## 2017-02-03 LAB — LIPID PANEL
CHOL/HDL RATIO: 2.1 ratio (ref <5.0)
Cholesterol: 157 mg/dL (ref <200)
HDL: 75 mg/dL (ref >50)
LDL CALC: 67 mg/dL (ref <100)
TRIGLYCERIDES: 76 mg/dL (ref <150)
VLDL: 15 mg/dL (ref <30)

## 2017-02-03 MED ORDER — ATORVASTATIN CALCIUM 20 MG PO TABS: 20.0000 mg | ORAL_TABLET | Freq: Every day | ORAL | 3 refills | Status: DC

## 2017-02-03 NOTE — Patient Instructions (Addendum)
Your physician recommends that you return for lab work TODAY  Your physician wants you to follow-up in: 6 months with Dr. Hilty. You will receive a reminder letter in the mail two months in advance. If you don't receive a letter, please call our office to schedule the follow-up appointment.   

## 2017-02-03 NOTE — Progress Notes (Signed)
OFFICE NOTE  Chief Complaint:  Recent headache  Primary Care Physician: Oliver Pila, MD  HPI:  Alyssa Warner is a 56 year-old female who was sent to me for palpitations and was found to have some PVCs many years ago. Recently has had some fluttering and underwent a 7-day monitor which showed no evidence of extrasystoles. In fact, she had 2 episodes of heart racing which correlated with a sinus rhythm at a rate of about 70. She did have some mild sinus tachycardia and heart rate varied between 50 and 130 over the week of the study. No significant abnormalities were noted. I also had her undergo metabolic testing due to occasional exertional chest pressure. She did very well with that MET-test that was performed on October 18, 2012, showed a normal exercise effort with a peak VO2 of 94%. Max predicted heart rate was slightly higher at 89% with a marked inflection after anaerobic threshold. There was a mild ischemic response with flattening of the VO2 curve; however, this is still a low risk study and I suspect could relate to microvascular dysfunction or some other abnormality at high rates of exercise. Overall, the treatment for this is increasing her exercise. I have given her exercise prescription for better cardiovascular conditioning. One could consider beta blockade; however, I do not think there is a clear indication and her chest fluttering has improved. Finally, we talked about addition of either CoQ10 or L-Arginine to her diet, both of which may be helpful in improving some of her ischemic dysfunction.  Alyssa Warner returns today for followup. She reports that recently she's been exercising and has noted some increasing shortness of breath with exertion. She says that she had cholesterol testing done in January of 2015. I have results of that which indicate total cholesterol 279, triglycerides 57, HDL 70 and LDL 198. Based on this she was started on low-dose Lipitor 10 mg  daily. She denies any true chest pain.  She reported still having problems with palpitations. And there was some shortness of breath with exercise. At her last visit I recommended starting low-dose Toprol. She seems to take that medication without any problems. She underwent repeat metabolic testing. This demonstrated improved exercise tolerance with a stable VO2 of 91%.  Alyssa Warner returns today for follow-up. Overall she is doing well. She denies any significant shortness of breath. She very occasionally gets palpitations but feels like they're well controlled on her beta blocker. Blood pressure is low today at 90/60 however she is asymptomatic. She says she rarely checks heart rate or blood pressure. She denies any presyncope, worsening fatigue or other symptoms and may be associated with low blood pressure.  07/24/2016  Alyssa Warner returns today for follow-up. She reports very infrequent palpitations that are generally well controlled. She denies any worsening shortness of breath with exertion. She has recently bought a bicycle and hurt her husband exercise regularly. They also have a beach house which they are going to be going to more regularly as well. Cholesterol is due for reassessment but was well controlled last year. She denies any chest pain.  02/03/2017  Alyssa Warner returns today for follow-up. She was recently seen in the emergency department with headache. She's recently been underlined stress dealing with an attorney and she has had subsequent headaches. I suspect they are tension headaches. She described pain in the back of her neck in her head which was very sharp. She denied any our or associated nausea, photosensitivity or a  photosensitivity or visual sensations suggestive of migraine. She did note however during one episode the blood pressure was elevated 200/70 when she was in the emergency department. They did not give her any medications but performed a head CT scan which  showed no evidence of stroke although there was age advanced atherosclerosis. After further discussion she's had other headache episodes for which she checked her blood pressure and was only elevated to 135, suggesting that the hypertension was not the cause of her headache, but more likely a result of her pain. She also reported me today that she does not previously taking her Lipitor routinely. We last assessed her cholesterol she was only using the Lipitor sporadically. Since December she says she has been taking Lipitor on a daily basis.  PMHx:  Past Medical History:  Diagnosis Date  . Depression   . Heart palpitations     Past Surgical History:  Procedure Laterality Date  . MET Test  10/2012   low risk study; peak VO2 94% predicted; ischemic myocardial dysfunction     FAMHx:  Family History  Problem Relation Age of Onset  . Breast cancer Mother   . Kidney disease Mother   . Hypertension Father   . Arrhythmia Father   . Heart disease Father   . Arrhythmia Brother     pacemaker    SOCHx:   reports that she quit smoking about 33 years ago. She has never used smokeless tobacco. She reports that she drinks alcohol. She reports that she does not use drugs.  ALLERGIES:  No Known Allergies  ROS: Pertinent items noted in HPI and remainder of comprehensive ROS otherwise negative.  HOME MEDS: Current Outpatient Prescriptions  Medication Sig Dispense Refill  . atorvastatin (LIPITOR) 20 MG tablet Take 1 tablet (20 mg total) by mouth daily. 90 tablet 3  . CALCIUM-VITAMIN A-VITAMIN D PO Take 1,000 mg by mouth daily.    . CO ENZYME Q-10 PO Take by mouth daily.    Marland Kitchen FLUoxetine (PROZAC) 20 MG capsule Take 1 capsule (20 mg total) by mouth daily. 30 capsule 0  . hydrocortisone cream 1 % Apply 1 application topically 2 (two) times daily.    . L-ARGININE PO Take by mouth 4 (four) times daily.    . Methylcobalamin (B12-ACTIVE PO) Take by mouth.    . metoprolol succinate (TOPROL-XL) 25 MG  24 hr tablet TAKE 0.5 TABLETS (12.5 MG TOTAL) BY MOUTH DAILY. 45 tablet 2   No current facility-administered medications for this visit.     LABS/IMAGING: No results found for this or any previous visit (from the past 48 hour(s)). No results found.  VITALS: BP 112/64 (BP Location: Left Arm)   Pulse 60   Ht 5' 5" (1.651 m)   Wt 157 lb (71.2 kg)   BMI 26.13 kg/m   EXAM: General appearance: alert and no distress Neck: no carotid bruit and no JVD Lungs: clear to auscultation bilaterally Heart: regular rate and rhythm, S1, S2 normal, no murmur, click, rub or gallop Abdomen: soft, non-tender; bowel sounds normal; no masses,  no organomegaly Extremities: extremities normal, atraumatic, no cyanosis or edema Pulses: 2+ and symmetric Skin: Skin color, texture, turgor normal. No rashes or lesions Neurologic: Grossly normal Psych: Normal  EKG: Deferred  ASSESSMENT: 1. Headaches - with associated hypertension 2. Palpitations - improved 3. Dyslipidemia-on Lipitor  PLAN: 1.   Alyssa Warner has recently been having headaches which sound like they're tension type. She's been under a lot of stress in  dealing with an attorney for various issues. She reports her palpitations are well controlled on low-dose beta blocker. Her blood pressure is actually at goal today and has been either normal or low almost every time I seen her in the office. It seems like these episodes of headaches or triggering high blood pressure when she is in pain but not always associated with high blood pressure. I do not see a reason to change her medicine at this time. I would try primary avoidance of triggers for her headaches and ultimately if they persist she may need to see a neurologist. She should keep a journal of headaches associated with blood pressure checks. She is due for repeat lipid check since she has been compliant with her Lipitor 20 mg daily. Based on those numbers we may need to consider up titrating her  medication with a goal LDL less than 70 given her intracranial atherosclerosis.  Pixie Casino, MD, [facility] Attending Cardiologist [place] [facility]  02/03/2017, 8:51 AM

## 2017-02-05 ENCOUNTER — Encounter: Payer: Self-pay | Admitting: Internal Medicine

## 2017-03-24 ENCOUNTER — Telehealth: Payer: Self-pay

## 2017-03-25 ENCOUNTER — Encounter: Payer: Self-pay | Admitting: Family Medicine

## 2017-03-25 ENCOUNTER — Ambulatory Visit (INDEPENDENT_AMBULATORY_CARE_PROVIDER_SITE_OTHER): Payer: 59 | Admitting: Family Medicine

## 2017-03-25 VITALS — BP 108/68 | HR 73 | Temp 98.6°F | Resp 16 | Ht 65.0 in | Wt 161.0 lb

## 2017-03-25 DIAGNOSIS — Z23 Encounter for immunization: Secondary | ICD-10-CM

## 2017-03-25 DIAGNOSIS — L309 Dermatitis, unspecified: Secondary | ICD-10-CM | POA: Diagnosis not present

## 2017-03-25 DIAGNOSIS — A09 Infectious gastroenteritis and colitis, unspecified: Secondary | ICD-10-CM | POA: Diagnosis not present

## 2017-03-25 DIAGNOSIS — F329 Major depressive disorder, single episode, unspecified: Secondary | ICD-10-CM

## 2017-03-25 DIAGNOSIS — F32A Depression, unspecified: Secondary | ICD-10-CM

## 2017-03-25 MED ORDER — CIPROFLOXACIN HCL 500 MG PO TABS: 500.0000 mg | ORAL_TABLET | Freq: Two times a day (BID) | ORAL | 0 refills | Status: AC

## 2017-03-25 MED ORDER — CRISABOROLE 2 % EX OINT: 1.0000 "application " | TOPICAL_OINTMENT | Freq: Two times a day (BID) | CUTANEOUS | 2 refills | Status: DC

## 2017-03-25 MED ORDER — FLUOXETINE HCL 20 MG PO CAPS: 20.0000 mg | ORAL_CAPSULE | Freq: Every day | ORAL | 1 refills | Status: DC

## 2017-03-25 NOTE — Progress Notes (Signed)
Pre visit review using our clinic review tool, if applicable. No additional management support is needed unless otherwise documented below in the visit note. 

## 2017-03-25 NOTE — Patient Instructions (Addendum)
If you do not hear anything about the cologard in the next 1-2 weeks, call our office and ask for an update.  If anything is too expensive, don't fill medicine and let us know.  Tell Bill hello! Have a great time on your trip!

## 2017-03-25 NOTE — Progress Notes (Signed)
Chief Complaint  Patient presents with  . Establish Care  . Eczema    needs refills on TAC cream  . Depression    needs refill on fluoxetine  . Immunizations    going out of the [place] would like Hep A and Tetanus       New Patient Visit SUBJECTIVE: HPI: Alyssa Warner is an 56 y.o.female who is being seen for establishing care.  The patient was previously seen in [facility].  Follows with GYN for woman's health issues.  Eczema On Kenalog 0.1% for lower back eczema. Helps, but comes back when she stops using it. Does use lotions. Gets whichever detergents are on sale, admits that she does use scented products. She does not have any drainage, swelling, spreading redness, or fevers.  Depression The patient has a history of depression and mood lability well-controlled on Prozac 20 mg daily. She has tried coming off in the past, however this has not worked well. She has no interest in coming off at this time. No self-medication, thoughts of harming herself or others. She does not follow with a counselor.  The patient is going to [place] at the end of May. She does visit the [facility] website and it was recommended that she get a hepatitis A vaccination as well as tetanus. She is requesting an antibiotic should she get traveler's diarrhea.  No Known Allergies  Past Medical History:  Diagnosis Date  . Depression   . Eczema   . Heart palpitations    Past Surgical History:  Procedure Laterality Date  . ANKLE SURGERY    . CHOLECYSTECTOMY    . MET Test  10/2012   low risk study; peak VO2 94% predicted; ischemic myocardial dysfunction   . TUBAL LIGATION     Social History   Social History  . Marital status: Married   Occupational History  .      owns a dog boarding house   Social History Main Topics  . Smoking status: Former Smoker    Quit date: 02/08/1984  . Smokeless tobacco: Never Used  . Alcohol use Yes     Comment: socially   . Drug use: No   Social History Narrative    Exercises some      Family History  Problem Relation Age of Onset  . Breast cancer Mother   . Kidney disease Mother   . Hypertension Father   . Arrhythmia Father   . Heart disease Father   . Arrhythmia Brother     pacemaker     Current Outpatient Prescriptions:  .  atorvastatin (LIPITOR) 20 MG tablet, Take 1 tablet (20 mg total) by mouth daily., Disp: 90 tablet, Rfl: 3 .  CALCIUM-VITAMIN A-VITAMIN D PO, Take 1,000 mg by mouth daily., Disp: , Rfl:  .  CO ENZYME Q-10 PO, Take by mouth daily., Disp: , Rfl:  .  FLUoxetine (PROZAC) 20 MG capsule, Take 1 capsule (20 mg total) by mouth daily., Disp: 90 capsule, Rfl: 1 .  L-ARGININE PO, Take by mouth 4 (four) times daily., Disp: , Rfl:  .  Methylcobalamin (B12-ACTIVE PO), Take by mouth., Disp: , Rfl:  .  metoprolol succinate (TOPROL-XL) 25 MG 24 hr tablet, TAKE 0.5 TABLETS (12.5 MG TOTAL) BY MOUTH DAILY., Disp: 45 tablet, Rfl: 2 .  triamcinolone cream (KENALOG) 0.1 %, Apply 1 application topically 2 (two) times daily., Disp: , Rfl:  .  ciprofloxacin (CIPRO) 500 MG tablet, Take 1 tablet (500 mg total) by mouth 2 (two)  times daily. Only use if having traveler's diarrhea., Disp: 10 tablet, Rfl: 0 .  Crisaborole (EUCRISA) 2 % OINT, Apply 1 application topically 2 (two) times daily. Thin layer over back., Disp: 1 Tube, Rfl: 2  No LMP recorded. Patient is postmenopausal.  ROS Skin: As noted in HPI  Psych: Denies SI or HI   OBJECTIVE: BP 108/68 (BP Location: Left Arm, Cuff Size: Normal)   Pulse 73   Temp 98.6 F (37 C) (Oral)   Resp 16   Ht '5\' 5"'  (1.651 m)   Wt 161 lb (73 kg)   SpO2 98%   BMI 26.79 kg/m   Constitutional: -  VS reviewed -  Well developed, well nourished, appears stated age -  No apparent distress  Psychiatric: -  Oriented to person, place, and time -  Memory intact -  Affect and mood normal -  Fluent conversation, good eye contact -  Judgment and insight age appropriate  Eye: -  Conjunctivae clear, no  discharge -  Pupils symmetric, round, reactive to light  ENMT: -  Oral mucosa without lesions, tongue and uvula midline    Tonsils not enlarged, no erythema, no exudate, trachea midline    Pharynx moist, no lesions, no erythema  Neck: -  No gross swelling, no palpable masses -  Thyroid midline, not enlarged, mobile, no palpable masses  Cardiovascular: -  RRR, no murmurs -  No LE edema  Respiratory: -  Normal respiratory effort, no accessory muscle use, no retraction -  Breath sounds equal, no wheezes, no ronchi, no crackles  Gastrointestinal: -  Bowel sounds normal -  No tenderness, no distention, no guarding, no masses  Neurological:  -  CN II - XII grossly intact -  Sensation grossly intact to light touch, equal bilaterally  Skin: -  Patch of mild erythema and excoriation on R lower back, no drainage, odor, streaking redness, or fluctuance -  Warm and dry to palpation   ASSESSMENT/PLAN: Eczema, unspecified type - Plan: Crisaborole (EUCRISA) 2 % OINT  Depression, unspecified depression type - Plan: FLUoxetine (PROZAC) 20 MG capsule  Traveler's diarrhea  Need for diphtheria-tetanus-pertussis (Tdap) vaccine  Try Eucrisa as she has not had long term benefit with Kenalog. If too expensive, will reorder Kenalog 0.1%. Cont using emollients daily. Avoid scented products as this can be irritating to skin.  Doing well on current dose of Prozac. Keep on current dose. She is going to [place] at the end of May. Will call in rx for Traveler's diarrhea should she need it.  Discussed colon cancer screening, will go forth with Cologard. Discussed that if positive, will need to refer to GI for colonoscopy. She did have one when she turned 16, but was told that she did not do well with the prep.  Patient should return in 6 mo or prn. The patient voiced understanding and agreement to the plan.   [place], DO 03/25/17  2:01 PM

## 2017-04-01 IMAGING — CT CT HEAD W/O CM
3 series · 16 of 47 positions shown, 19 images · non-contrast
Comparison: None.

CLINICAL DATA: Acute onset headache at the vertex today.
Hypertension.

EXAM:
CT HEAD WITHOUT CONTRAST
TECHNIQUE: Contiguous axial images were obtained from the base of the skull
through the vertex without intravenous contrast.

[Series 2: head wo · axial · 0.41mm/px · z∈[-159,-34]mm · 10 of 30 slices shown, 13 images]
[im 3/30  brain]
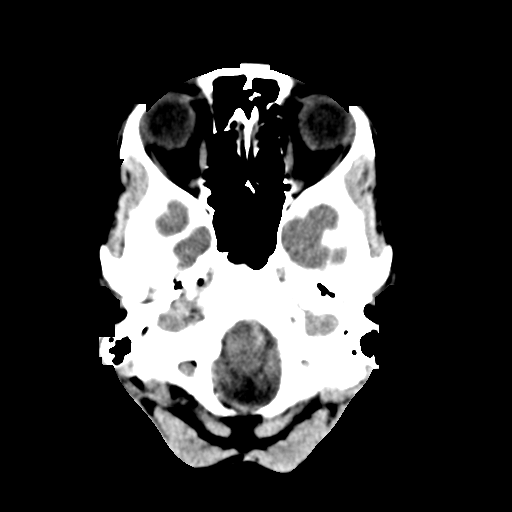
[im 3/30  bone]
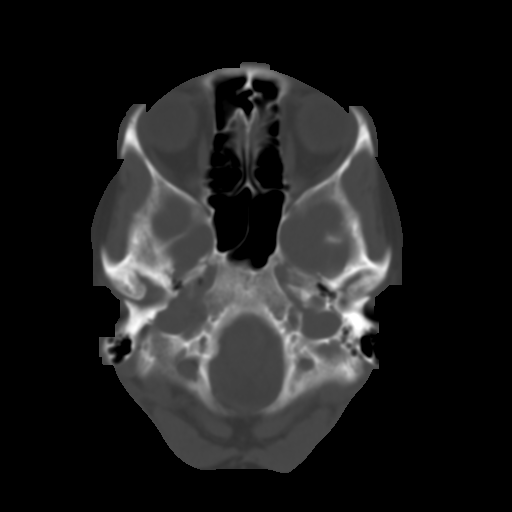
[im 6/30  brain]
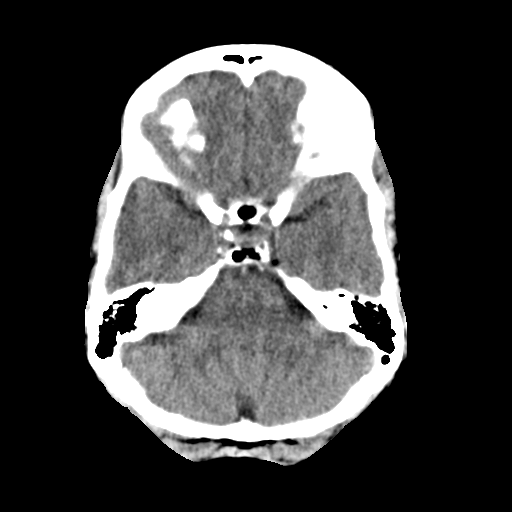
[im 9/30  brain]
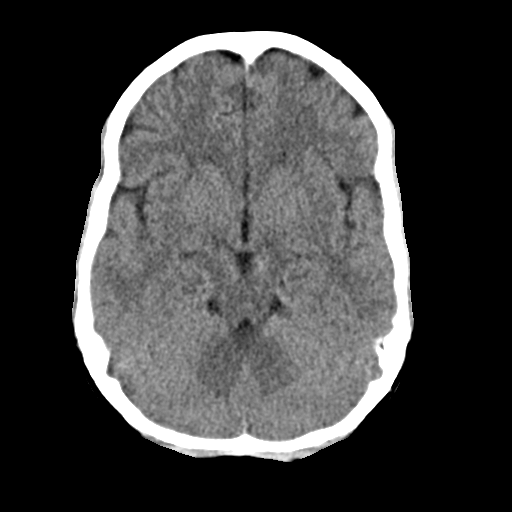
[im 11/30  brain]
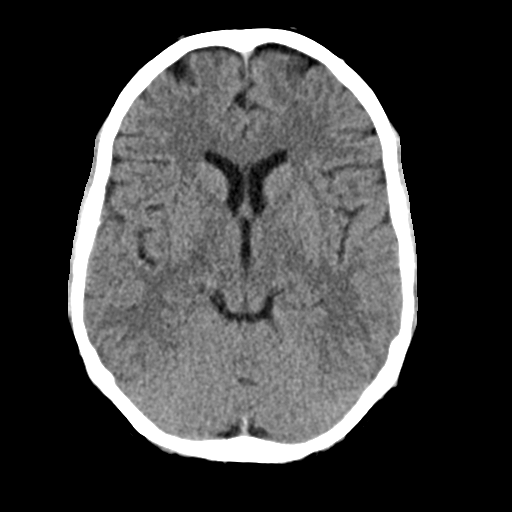
[im 14/30  brain]
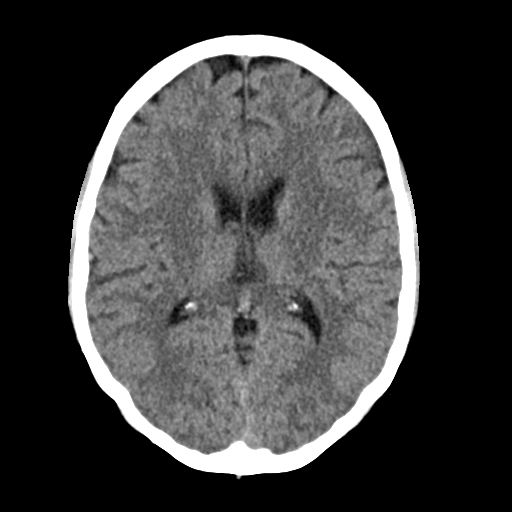
[im 14/30  bone]
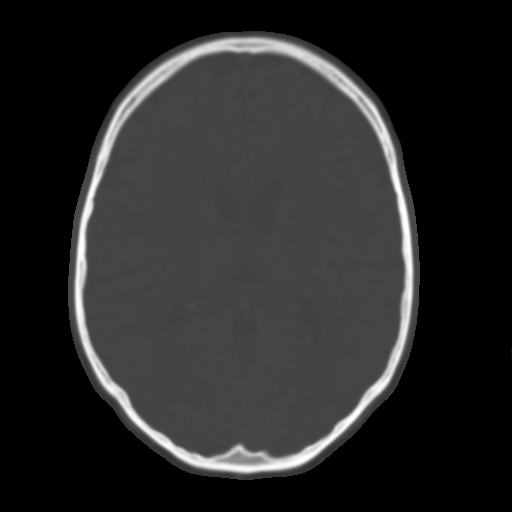
[im 17/30  brain]
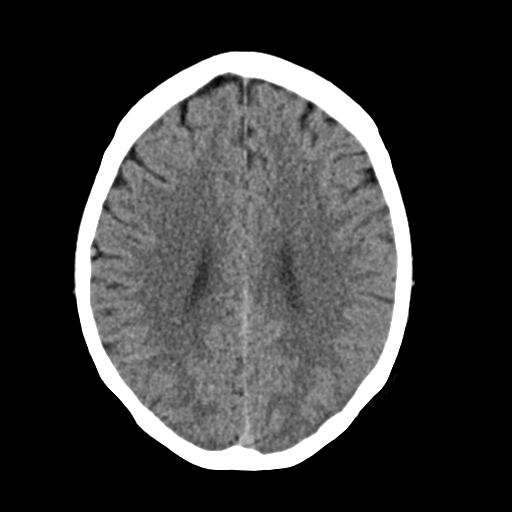
[im 20/30  brain]
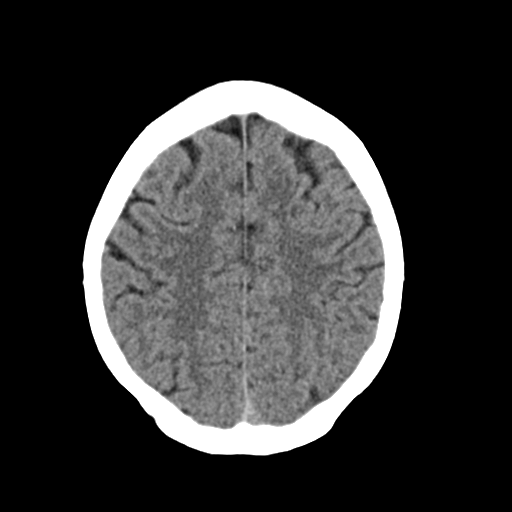
[im 23/30  brain]
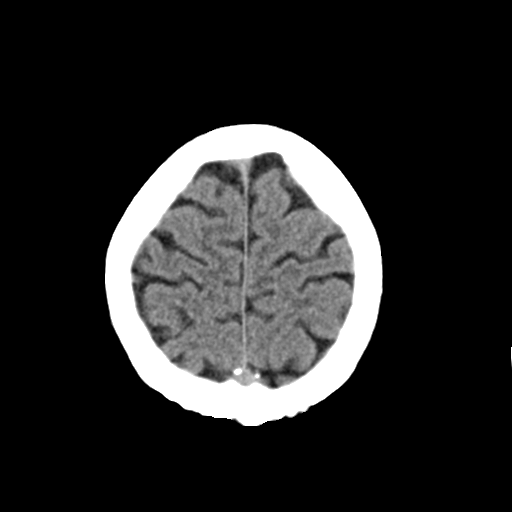
[im 25/30  brain]
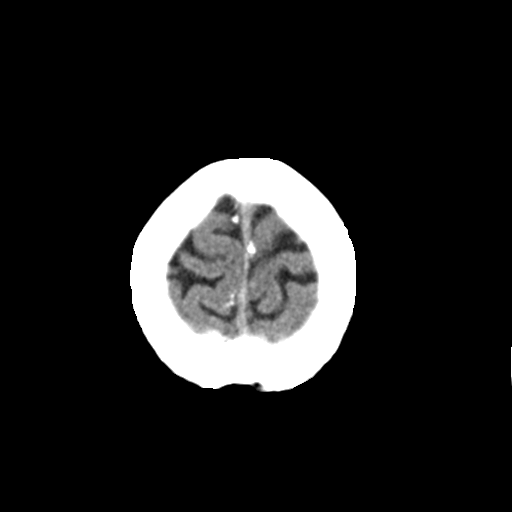
[im 25/30  bone]
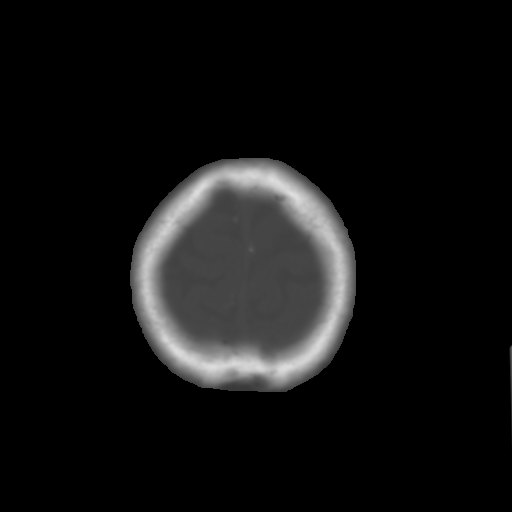
[im 28/30  brain]
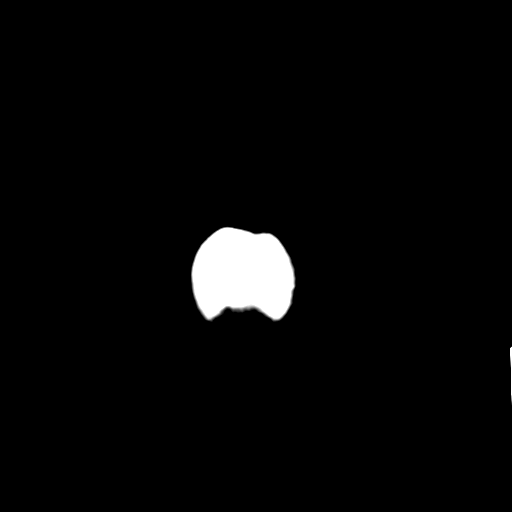

[Series 4: coronal soft · coronal · 0.29mm/px · 3 of 67 slices shown]
[im 23/67  brain]
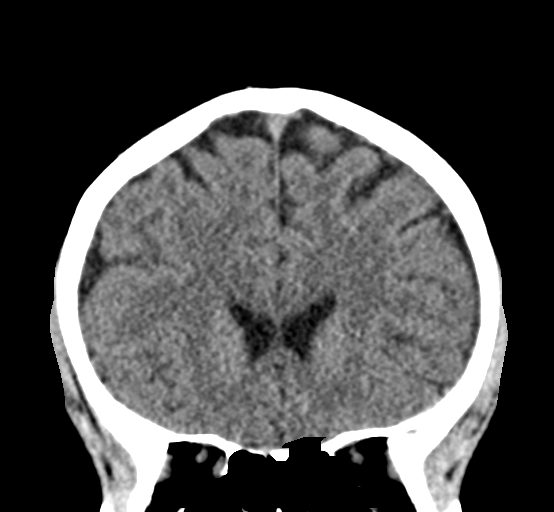
[im 30/67  brain]
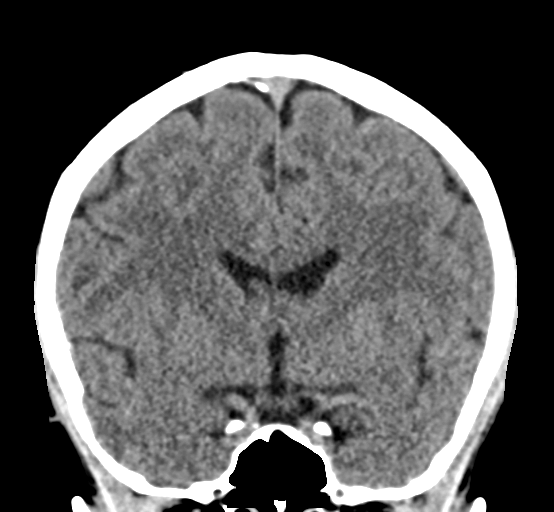
[im 37/67  brain]
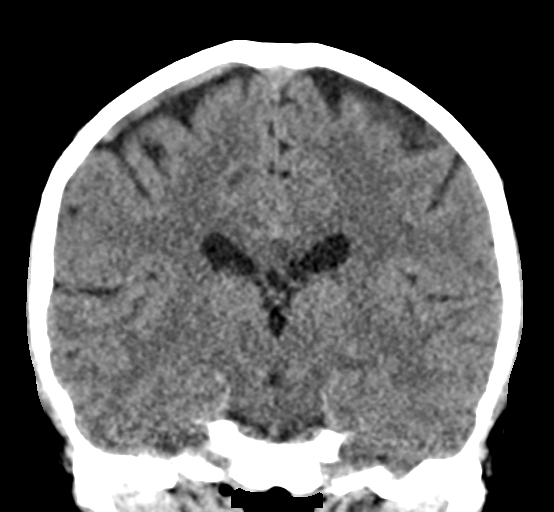

[Series 5: sag soft · sagittal · 0.30mm/px · 3 of 53 slices shown]
[im 18/53  brain]
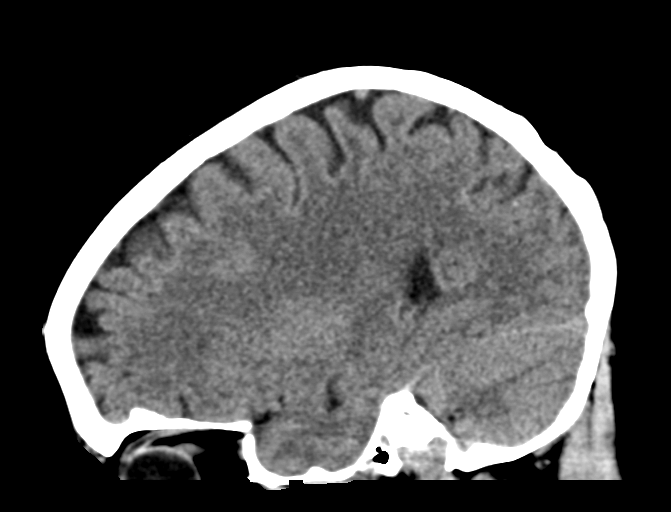
[im 27/53  brain]
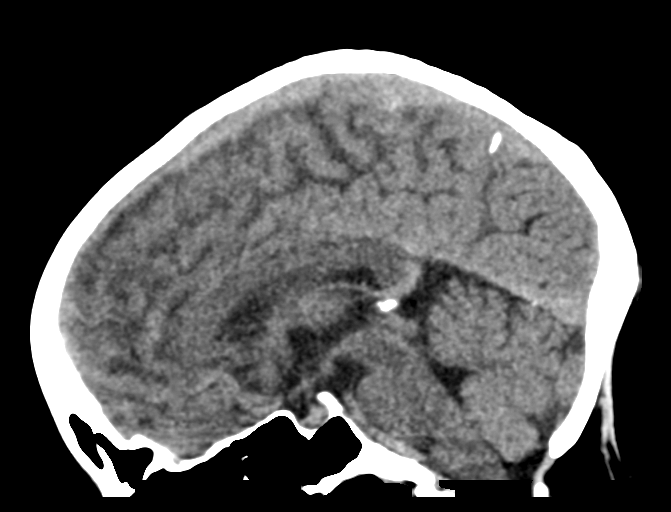
[im 35/53  brain]
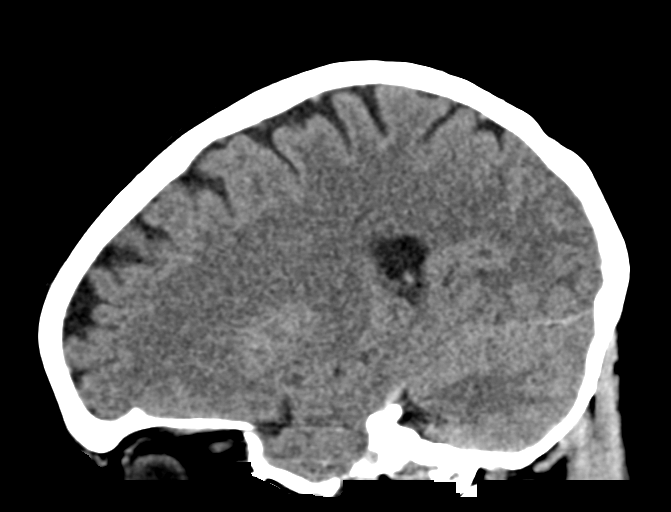

[16 of 47 positions shown; findings below may reference images not displayed]

FINDINGS: Brain: No acute abnormality including hemorrhage, infarct, mass
lesion, mass effect, midline shift or abnormal extra-axial fluid
collection. No hydrocephalus or pneumocephalus.

Vascular: Mild atherosclerosis noted.

Skull: Intact.

Sinuses/Orbits: Negative.

Other: None.
IMPRESSION: No acute abnormality.

Mild atherosclerosis.

## 2017-04-03 NOTE — Telephone Encounter (Signed)
Done

## 2017-04-15 ENCOUNTER — Encounter: Payer: Self-pay | Admitting: Family Medicine

## 2017-04-15 LAB — COLOGUARD

## 2017-04-20 ENCOUNTER — Encounter: Payer: Self-pay | Admitting: Family Medicine

## 2017-04-23 NOTE — Telephone Encounter (Signed)
Please advise.//AB/CMA 

## 2017-05-06 ENCOUNTER — Telehealth: Payer: Self-pay | Admitting: *Deleted

## 2017-05-06 NOTE — Telephone Encounter (Signed)
Called and spoke with the pt's husband and informed him that the pt's Cologuard results were Negative.   He verbalized understanding.  Informed him if the pt has any questions she can give me a call back.//AB/CMA

## 2017-06-08 ENCOUNTER — Ambulatory Visit (INDEPENDENT_AMBULATORY_CARE_PROVIDER_SITE_OTHER): Payer: 59 | Admitting: Family

## 2017-06-08 ENCOUNTER — Encounter: Payer: Self-pay | Admitting: Family

## 2017-06-08 VITALS — BP 110/51 | HR 70 | Temp 98.4°F | Resp 16 | Ht 65.0 in | Wt 163.4 lb

## 2017-06-08 DIAGNOSIS — L03012 Cellulitis of left finger: Secondary | ICD-10-CM

## 2017-06-08 MED ORDER — DOXYCYCLINE HYCLATE 100 MG PO TABS: 100.0000 mg | ORAL_TABLET | Freq: Two times a day (BID) | ORAL | 0 refills | Status: DC

## 2017-06-08 NOTE — Progress Notes (Signed)
Subjective:    Patient ID: Alyssa Warner, female    DOB: 1961/07/30, 56 y.o.   MRN: [id number]  HPI  Patient is a 56 year old female who presents today with chief complaint of right thumb pain.  This occurred approximately 10 days ago. It happened while she was cleaning crawfish. She thinks that a part of the shell poked under her nail.   Reports that it felt like a sore or ab cuticle for about 4 days then on the 5th day it started to "puff up." She did have some green drainage briefly, and notes that her husband "stuck a needle in it." However, she states that she did not get much drainage after he did this. She reports the area is tender.   She reports that she did have some Cipro on hand and she has been taking it for several days without much improvement.   Review of Systems    see HPI  Past Medical History:  Diagnosis Date  . Depression   . Eczema   . Heart palpitations      Social History   Social History  . Marital status: Married    Spouse name: N/A  . Number of children: N/A  . Years of education: N/A   Occupational History  .      owns a dog boarding house   Social History Main Topics  . Smoking status: Former Smoker    Quit date: 02/08/1984  . Smokeless tobacco: Never Used  . Alcohol use Yes     Comment: socially   . Drug use: No  . Sexual activity: Not on file   Other Topics Concern  . Not on file   Social History Narrative   Exercises some       Past Surgical History:  Procedure Laterality Date  . ANKLE SURGERY    . CHOLECYSTECTOMY    . MET Test  10/2012   low risk study; peak VO2 94% predicted; ischemic myocardial dysfunction   . TUBAL LIGATION      Family History  Problem Relation Age of Onset  . Breast cancer Mother   . Kidney disease Mother   . Hypertension Father   . Arrhythmia Father   . Heart disease Father   . Arrhythmia Brother        pacemaker    No Known Allergies  Current Outpatient Prescriptions on File Prior to Visit    Medication Sig Dispense Refill  . atorvastatin (LIPITOR) 20 MG tablet Take 1 tablet (20 mg total) by mouth daily. 90 tablet 3  . CALCIUM-VITAMIN A-VITAMIN D PO Take 1,000 mg by mouth daily.    . CO ENZYME Q-10 PO Take by mouth daily.    Marland Kitchen FLUoxetine (PROZAC) 20 MG capsule Take 1 capsule (20 mg total) by mouth daily. 90 capsule 1  . L-ARGININE PO Take by mouth 4 (four) times daily.    . Methylcobalamin (B12-ACTIVE PO) Take by mouth.    . metoprolol succinate (TOPROL-XL) 25 MG 24 hr tablet TAKE 0.5 TABLETS (12.5 MG TOTAL) BY MOUTH DAILY. 45 tablet 2   No current facility-administered medications on file prior to visit.     BP (!) 110/51 (BP Location: Left Arm, Cuff Size: Normal)   Pulse 70   Temp 98.4 F (36.9 C) (Oral)   Resp 16   Ht '5\' 5"'  (1.651 m)   Wt 163 lb 6.4 oz (74.1 kg)   SpO2 100%   BMI 27.19 kg/m    Objective:  Physical Exam  Constitutional: She appears well-developed and well-nourished.  Psychiatric: She has a normal mood and affect. Her behavior is normal. Judgment and thought content normal.      Skin- + Discoloration noted on lateral nail bed. There is no fluctuance or drainage noted. Area is firm.     Assessment & Plan:  Paronychia- I do not appreciate any area of fluctuance which would be amenable to drainage. I advised patient to stop Cipro. Will change to doxycycline for MRSA coverage. She is advised as follows:  Soak finger twice daily in hydrogen peroxide and then apply antibiotic ointment to the affected area. Call if you develop increased pain, redness, swelling or drainage or if you develop fever.  Follow up with Dr. Nani Ravens in 1 week.

## 2017-06-08 NOTE — Patient Instructions (Signed)
Stop cipro. Begin doxycycline.  Soak finger twice daily in hydrogen peroxide and then apply antibiotic ointment to the affected area. Call if you develop increased pain, redness, swelling or drainage or if you develop fever.  Follow up with Dr. Carmelia RollerWendling in 1 week.

## 2017-09-20 ENCOUNTER — Other Ambulatory Visit: Payer: Self-pay | Admitting: Family Medicine

## 2017-09-20 DIAGNOSIS — F329 Major depressive disorder, single episode, unspecified: Secondary | ICD-10-CM

## 2017-09-20 DIAGNOSIS — F32A Depression, unspecified: Secondary | ICD-10-CM

## 2017-12-25 ENCOUNTER — Other Ambulatory Visit: Payer: Self-pay | Admitting: Family Medicine

## 2017-12-25 DIAGNOSIS — F32A Depression, unspecified: Secondary | ICD-10-CM

## 2017-12-25 DIAGNOSIS — F329 Major depressive disorder, single episode, unspecified: Secondary | ICD-10-CM

## 2017-12-25 NOTE — Telephone Encounter (Signed)
30 day supply of fluoxetine sent to pharmacy. Pt last seen by PCP in 03/2017 and was advised f/u in 6 months. Pt is past due. Sent message via mychart for pt to schedule appt soon.

## 2018-02-15 ENCOUNTER — Other Ambulatory Visit: Payer: Self-pay | Admitting: Family Medicine

## 2018-02-15 DIAGNOSIS — F32A Depression, unspecified: Secondary | ICD-10-CM

## 2018-02-15 DIAGNOSIS — F329 Major depressive disorder, single episode, unspecified: Secondary | ICD-10-CM

## 2018-03-01 ENCOUNTER — Other Ambulatory Visit: Payer: Self-pay

## 2018-03-01 MED ORDER — ATORVASTATIN CALCIUM 20 MG PO TABS: 20.0000 mg | ORAL_TABLET | Freq: Every day | ORAL | 0 refills | Status: DC

## 2018-03-15 ENCOUNTER — Other Ambulatory Visit: Payer: Self-pay | Admitting: Family Medicine

## 2018-03-15 DIAGNOSIS — F329 Major depressive disorder, single episode, unspecified: Secondary | ICD-10-CM

## 2018-03-15 DIAGNOSIS — F32A Depression, unspecified: Secondary | ICD-10-CM

## 2018-03-15 MED ORDER — FLUOXETINE HCL 20 MG PO CAPS: ORAL_CAPSULE | ORAL | 0 refills | Status: DC

## 2018-05-23 ENCOUNTER — Other Ambulatory Visit: Payer: Self-pay | Admitting: Internal Medicine

## 2018-06-10 ENCOUNTER — Other Ambulatory Visit: Payer: Self-pay | Admitting: Family Medicine

## 2018-06-10 DIAGNOSIS — F32A Depression, unspecified: Secondary | ICD-10-CM

## 2018-06-10 DIAGNOSIS — F329 Major depressive disorder, single episode, unspecified: Secondary | ICD-10-CM

## 2018-08-21 ENCOUNTER — Other Ambulatory Visit: Payer: Self-pay | Admitting: Internal Medicine

## 2018-09-08 ENCOUNTER — Encounter: Payer: Self-pay | Admitting: Internal Medicine

## 2018-09-08 ENCOUNTER — Ambulatory Visit (INDEPENDENT_AMBULATORY_CARE_PROVIDER_SITE_OTHER): Payer: 59 | Admitting: Internal Medicine

## 2018-09-08 VITALS — BP 112/80 | HR 61 | Ht 65.0 in | Wt 160.0 lb

## 2018-09-08 DIAGNOSIS — R002 Palpitations: Secondary | ICD-10-CM | POA: Diagnosis not present

## 2018-09-08 DIAGNOSIS — E785 Hyperlipidemia, unspecified: Secondary | ICD-10-CM | POA: Diagnosis not present

## 2018-09-08 DIAGNOSIS — G44209 Tension-type headache, unspecified, not intractable: Secondary | ICD-10-CM | POA: Diagnosis not present

## 2018-09-08 DIAGNOSIS — R55 Syncope and collapse: Secondary | ICD-10-CM

## 2018-09-08 LAB — LIPID PANEL
CHOL/HDL RATIO: 2.3 ratio (ref 0.0–4.4)
Cholesterol, Total: 174 mg/dL (ref 100–199)
HDL: 77 mg/dL (ref >39)
LDL Calculated: 81 mg/dL (ref 0–99)
Triglycerides: 82 mg/dL (ref 0–149)
VLDL CHOLESTEROL CAL: 16 mg/dL (ref 5–40)

## 2018-09-08 MED ORDER — ATORVASTATIN CALCIUM 20 MG PO TABS: 20.0000 mg | ORAL_TABLET | Freq: Every day | ORAL | 3 refills | Status: DC

## 2018-09-08 NOTE — Progress Notes (Signed)
OFFICE NOTE  Chief Complaint:  Dizziness, syncope  Primary Care Physician: Shelda Pal, DO  HPI:  Alyssa Warner is a 57 year-old female who was sent to me for palpitations and was found to have some PVCs many years ago. Recently has had some fluttering and underwent a 7-day monitor which showed no evidence of extrasystoles. In fact, she had 2 episodes of heart racing which correlated with a sinus rhythm at a rate of about 70. She did have some mild sinus tachycardia and heart rate varied between 50 and 130 over the week of the study. No significant abnormalities were noted. I also had her undergo metabolic testing due to occasional exertional chest pressure. She did very well with that MET-test that was performed on October 18, 2012, showed a normal exercise effort with a peak VO2 of 94%. Max predicted heart rate was slightly higher at 89% with a marked inflection after anaerobic threshold. There was a mild ischemic response with flattening of the VO2 curve; however, this is still a low risk study and I suspect could relate to microvascular dysfunction or some other abnormality at high rates of exercise. Overall, the treatment for this is increasing her exercise. I have given her exercise prescription for better cardiovascular conditioning. One could consider beta blockade; however, I do not think there is a clear indication and her chest fluttering has improved. Finally, we talked about addition of either CoQ10 or L-Arginine to her diet, both of which may be helpful in improving some of her ischemic dysfunction.  Alyssa Warner returns today for followup. She reports that recently she's been exercising and has noted some increasing shortness of breath with exertion. She says that she had cholesterol testing done in January of 2015. I have results of that which indicate total cholesterol 279, triglycerides 57, HDL 70 and LDL 198. Based on this she was started on low-dose Lipitor 10  mg daily. She denies any true chest pain.  She reported still having problems with palpitations. And there was some shortness of breath with exercise. At her last visit I recommended starting low-dose Toprol. She seems to take that medication without any problems. She underwent repeat metabolic testing. This demonstrated improved exercise tolerance with a stable VO2 of 91%.  Alyssa Warner returns today for follow-up. Overall she is doing well. She denies any significant shortness of breath. She very occasionally gets palpitations but feels like they're well controlled on her beta blocker. Blood pressure is low today at 90/60 however she is asymptomatic. She says she rarely checks heart rate or blood pressure. She denies any presyncope, worsening fatigue or other symptoms and may be associated with low blood pressure.  07/24/2016  Alyssa Warner returns today for follow-up. She reports very infrequent palpitations that are generally well controlled. She denies any worsening shortness of breath with exertion. She has recently bought a bicycle and hurt her husband exercise regularly. They also have a beach house which they are going to be going to more regularly as well. Cholesterol is due for reassessment but was well controlled last year. She denies any chest pain.  02/03/2017  Alyssa Warner returns today for follow-up. She was recently seen in the emergency department with headache. She's recently been underlined stress dealing with an attorney and she has had subsequent headaches. I suspect they are tension headaches. She described pain in the back of her neck in her head which was very sharp. She denied any our or associated nausea, photosensitivity  or a photosensitivity or visual sensations suggestive of migraine. She did note however during one episode the blood pressure was elevated 200/70 when she was in the emergency department. They did not give her any medications but performed a head CT scan  which showed no evidence of stroke although there was age advanced atherosclerosis. After further discussion she's had other headache episodes for which she checked her blood pressure and was only elevated to 135, suggesting that the hypertension was not the cause of her headache, but more likely a result of her pain. She also reported me today that she does not previously taking her Lipitor routinely. We last assessed her cholesterol she was only using the Lipitor sporadically. Since December she says she has been taking Lipitor on a daily basis.  09/08/2018  Alyssa Warner is seen today in follow-up.  She reports that her stress is improved significantly.  Her headaches have improved as well.  She did stop her beta-blocker and notes that she has been having palpitations which she is aware of.  She does not think that they were necessarily improved on the beta-blocker.  Blood pressure is normal today.  She does recall one episode of syncope while she was on an airplane and another episode of presyncope when she was playing pickle ball.  Both of these episodes were preceded by nausea, mild clamminess, dizziness and a clear prodrome suggestive of vasovagal syncope.  These episodes were while taking her beta-blocker.  She denies any chest pain or worsening shortness of breath however she is not exercising regularly.   PMHx:  Past Medical History:  Diagnosis Date  . Depression   . Eczema   . Heart palpitations     Past Surgical History:  Procedure Laterality Date  . ANKLE SURGERY    . CHOLECYSTECTOMY    . MET Test  10/2012   low risk study; peak VO2 94% predicted; ischemic myocardial dysfunction   . TUBAL LIGATION      FAMHx:  Family History  Problem Relation Age of Onset  . Breast cancer Mother   . Kidney disease Mother   . Hypertension Father   . Arrhythmia Father   . Heart disease Father   . Arrhythmia Brother        pacemaker    SOCHx:   reports that she quit smoking about 34  years ago. She has never used smokeless tobacco. She reports that she drinks alcohol. She reports that she does not use drugs.  ALLERGIES:  No Known Allergies  ROS: Pertinent items noted in HPI and remainder of comprehensive ROS otherwise negative.  HOME MEDS: Current Outpatient Medications  Medication Sig Dispense Refill  . atorvastatin (LIPITOR) 20 MG tablet TAKE 1 TABLET BY MOUTH EVERY DAY *NEED APPT FOR MORE REFILLS* 30 tablet 0  . FLUoxetine (PROZAC) 20 MG capsule TAKE 1 CAPSULE BY MOUTH EVERY DAY 90 capsule 0   No current facility-administered medications for this visit.     LABS/IMAGING: No results found for this or any previous visit (from the past 48 hour(s)). No results found.  VITALS: BP 112/80   Pulse 61   Ht '5\' 5"'  (1.651 m)   Wt 160 lb (72.6 kg)   BMI 26.63 kg/m   EXAM: General appearance: alert and no distress Neck: no carotid bruit and no JVD Lungs: clear to auscultation bilaterally Heart: regular rate and rhythm, S1, S2 normal, no murmur, click, rub or gallop Abdomen: soft, non-tender; bowel sounds normal; no masses,  no organomegaly  Extremities: extremities normal, atraumatic, no cyanosis or edema Pulses: 2+ and symmetric Skin: Skin color, texture, turgor normal. No rashes or lesions Neurologic: Grossly normal Psych: Normal  EKG: Normal sinus rhythm, incomplete right bundle branch block at 61 personally reviewed  ASSESSMENT: 1. Vasovagal syncope 2. Tension headaches 3. Palpitations 4. Dyslipidemia-on Lipitor  PLAN: 1.   Alyssa Warner describes 2 episodes of what is likely vasovagal syncope/presyncope.  Both episodes had a clear prodrome.  She was on beta-blocker which has been discontinued.  Her headaches are most likely tension headaches his blood pressure appears to be normal today.  Her elevated blood pressures might have been related to stress.  Palpitations continue were not necessarily better on beta-blocker.  She reports compliance with  atorvastatin and will check a fasting lipid profile today.  Plan follow-up annually or sooner as necessary.  Pixie Casino, MD, [facility], [place] Director of the [facility] Lip[facility]  Card[facility] Diplomate of the American Board of Clinical Lipidology Attending Cardiologist  Direct Dial: [phone]  Fax: [phone]  Website:  [email or web address] 09/08/2018, 8:13 AM

## 2018-09-08 NOTE — Patient Instructions (Signed)
Your physician recommends that you return for lab work today  Your physician wants you to follow-up in: ONE YEAR with Dr. Rennis Golden. You will receive an email via MyChart when it is time to schedule an appointment.

## 2018-10-13 ENCOUNTER — Encounter: Payer: Self-pay | Admitting: Family Medicine

## 2018-10-13 ENCOUNTER — Ambulatory Visit (INDEPENDENT_AMBULATORY_CARE_PROVIDER_SITE_OTHER): Payer: 59 | Admitting: Family Medicine

## 2018-10-13 VITALS — BP 130/80 | HR 67 | Temp 98.1°F | Ht 65.0 in | Wt 160.0 lb

## 2018-10-13 DIAGNOSIS — M545 Low back pain, unspecified: Secondary | ICD-10-CM

## 2018-10-13 DIAGNOSIS — F329 Major depressive disorder, single episode, unspecified: Secondary | ICD-10-CM | POA: Diagnosis not present

## 2018-10-13 DIAGNOSIS — F32A Depression, unspecified: Secondary | ICD-10-CM

## 2018-10-13 MED ORDER — FLUOXETINE HCL 20 MG PO CAPS: ORAL_CAPSULE | ORAL | 0 refills | Status: DC

## 2018-10-13 NOTE — Progress Notes (Signed)
Pre visit review using our clinic review tool, if applicable. No additional management support is needed unless otherwise documented below in the visit note. 

## 2018-10-13 NOTE — Patient Instructions (Signed)
Heat (pad or rice pillow in microwave) over affected area, 10-15 minutes twice daily.   OK to take Tylenol 1000 mg (2 extra strength tabs) or 975 mg (3 regular strength tabs) every 6 hours as needed.  EXERCISES  RANGE OF MOTION (ROM) AND STRETCHING EXERCISES - Low Back Pain Most people with lower back pain will find that their symptoms get worse with excessive bending forward (flexion) or arching at the lower back (extension). The exercises that will help resolve your symptoms will focus on the opposite motion.  If you have pain, numbness or tingling which travels down into your buttocks, leg or foot, the goal of the therapy is for these symptoms to move closer to your back and eventually resolve. Sometimes, these leg symptoms will get better, but your lower back pain may worsen. This is often an indication of progress in your rehabilitation. Be very alert to any changes in your symptoms and the activities in which you participated in the 24 hours prior to the change. Sharing this information with your caregiver will allow him or her to most efficiently treat your condition. These exercises may help you when beginning to rehabilitate your injury. Your symptoms may resolve with or without further involvement from your physician, physical therapist or athletic trainer. While completing these exercises, remember:   Restoring tissue flexibility helps normal motion to return to the joints. This allows healthier, less painful movement and activity.  An effective stretch should be held for at least 30 seconds.  A stretch should never be painful. You should only feel a gentle lengthening or release in the stretched tissue. FLEXION RANGE OF MOTION AND STRETCHING EXERCISES:  STRETCH - Flexion, Single Knee to Chest   Lie on a firm bed or floor with both legs extended in front of you.  Keeping one leg in contact with the floor, bring your opposite knee to your chest. Hold your leg in place by either  grabbing behind your thigh or at your knee.  Pull until you feel a gentle stretch in your low back. Hold 30 seconds.  Slowly release your grasp and repeat the exercise with the opposite side. Repeat 2 times. Complete this exercise 3 times per week.   STRETCH - Flexion, Double Knee to Chest  Lie on a firm bed or floor with both legs extended in front of you.  Keeping one leg in contact with the floor, bring your opposite knee to your chest.  Tense your stomach muscles to support your back and then lift your other knee to your chest. Hold your legs in place by either grabbing behind your thighs or at your knees.  Pull both knees toward your chest until you feel a gentle stretch in your low back. Hold 30 seconds.  Tense your stomach muscles and slowly return one leg at a time to the floor. Repeat 2 times. Complete this exercise 3 times per week.   STRETCH - Low Trunk Rotation  Lie on a firm bed or floor. Keeping your legs in front of you, bend your knees so they are both pointed toward the ceiling and your feet are flat on the floor.  Extend your arms out to the side. This will stabilize your upper body by keeping your shoulders in contact with the floor.  Gently and slowly drop both knees together to one side until you feel a gentle stretch in your low back. Hold for 30 seconds.  Tense your stomach muscles to support your lower back as you   bring your knees back to the starting position. Repeat the exercise to the other side. Repeat 2 times. Complete this exercise at least 3 times per week.   EXTENSION RANGE OF MOTION AND FLEXIBILITY EXERCISES:  STRETCH - Extension, Prone on Elbows   Lie on your stomach on the floor, a bed will be too soft. Place your palms about shoulder width apart and at the height of your head.  Place your elbows under your shoulders. If this is too painful, stack pillows under your chest.  Allow your body to relax so that your hips drop lower and make contact  more completely with the floor.  Hold this position for 30 seconds.  Slowly return to lying flat on the floor. Repeat 2 times. Complete this exercise 3 times per week.   RANGE OF MOTION - Extension, Prone Press Ups  Lie on your stomach on the floor, a bed will be too soft. Place your palms about shoulder width apart and at the height of your head.  Keeping your back as relaxed as possible, slowly straighten your elbows while keeping your hips on the floor. You may adjust the placement of your hands to maximize your comfort. As you gain motion, your hands will come more underneath your shoulders.  Hold this position 30 seconds.  Slowly return to lying flat on the floor. Repeat 2 times. Complete this exercise 3 times per week.   RANGE OF MOTION- Quadruped, Neutral Spine   Assume a hands and knees position on a firm surface. Keep your hands under your shoulders and your knees under your hips. You may place padding under your knees for comfort.  Drop your head and point your tailbone toward the ground below you. This will round out your lower back like an angry cat. Hold this position for 30 seconds.  Slowly lift your head and release your tail bone so that your back sags into a large arch, like an old horse.  Hold this position for 30 seconds.  Repeat this until you feel limber in your low back.  Now, find your "sweet spot." This will be the most comfortable position somewhere between the two previous positions. This is your neutral spine. Once you have found this position, tense your stomach muscles to support your low back.  Hold this position for 30 seconds. Repeat 2 times. Complete this exercise 3 times per week.   STRENGTHENING EXERCISES - Low Back Sprain These exercises may help you when beginning to rehabilitate your injury. These exercises should be done near your "sweet spot." This is the neutral, low-back arch, somewhere between fully rounded and fully arched, that is your  least painful position. When performed in this safe range of motion, these exercises can be used for people who have either a flexion or extension based injury. These exercises may resolve your symptoms with or without further involvement from your physician, physical therapist or athletic trainer. While completing these exercises, remember:   Muscles can gain both the endurance and the strength needed for everyday activities through controlled exercises.  Complete these exercises as instructed by your physician, physical therapist or athletic trainer. Increase the resistance and repetitions only as guided.  You may experience muscle soreness or fatigue, but the pain or discomfort you are trying to eliminate should never worsen during these exercises. If this pain does worsen, stop and make certain you are following the directions exactly. If the pain is still present after adjustments, discontinue the exercise until you can discuss   the trouble with your caregiver.  STRENGTHENING - Deep Abdominals, Pelvic Tilt   Lie on a firm bed or floor. Keeping your legs in front of you, bend your knees so they are both pointed toward the ceiling and your feet are flat on the floor.  Tense your lower abdominal muscles to press your low back into the floor. This motion will rotate your pelvis so that your tail bone is scooping upwards rather than pointing at your feet or into the floor. With a gentle tension and even breathing, hold this position for 3 seconds. Repeat 2 times. Complete this exercise 3 times per week.   STRENGTHENING - Abdominals, Crunches   Lie on a firm bed or floor. Keeping your legs in front of you, bend your knees so they are both pointed toward the ceiling and your feet are flat on the floor. Cross your arms over your chest.  Slightly tip your chin down without bending your neck.  Tense your abdominals and slowly lift your trunk high enough to just clear your shoulder blades. Lifting  higher can put excessive stress on the lower back and does not further strengthen your abdominal muscles.  Control your return to the starting position. Repeat 2 times. Complete this exercise 3 times per week.   STRENGTHENING - Quadruped, Opposite UE/LE Lift   Assume a hands and knees position on a firm surface. Keep your hands under your shoulders and your knees under your hips. You may place padding under your knees for comfort.  Find your neutral spine and gently tense your abdominal muscles so that you can maintain this position. Your shoulders and hips should form a rectangle that is parallel with the floor and is not twisted.  Keeping your trunk steady, lift your right hand no higher than your shoulder and then your left leg no higher than your hip. Make sure you are not holding your breath. Hold this position for 30 seconds.  Continuing to keep your abdominal muscles tense and your back steady, slowly return to your starting position. Repeat with the opposite arm and leg. Repeat 2 times. Complete this exercise 3 times per week.   STRENGTHENING - Abdominals and Quadriceps, Straight Leg Raise   Lie on a firm bed or floor with both legs extended in front of you.  Keeping one leg in contact with the floor, bend the other knee so that your foot can rest flat on the floor.  Find your neutral spine, and tense your abdominal muscles to maintain your spinal position throughout the exercise.  Slowly lift your straight leg off the floor about 6 inches for a count of 3, making sure to not hold your breath.  Still keeping your neutral spine, slowly lower your leg all the way to the floor. Repeat this exercise with each leg 2 times. Complete this exercise 3 times per week.  POSTURE AND BODY MECHANICS CONSIDERATIONS - Low Back Sprain Keeping correct posture when sitting, standing or completing your activities will reduce the stress put on different body tissues, allowing injured tissues a  chance to heal and limiting painful experiences. The following are general guidelines for improved posture.  While reading these guidelines, remember:  The exercises prescribed by your provider will help you have the flexibility and strength to maintain correct postures.  The correct posture provides the best environment for your joints to work. All of your joints have less wear and tear when properly supported by a spine with good posture. This means you   will experience a healthier, less painful body.  Correct posture must be practiced with all of your activities, especially prolonged sitting and standing. Correct posture is as important when doing repetitive low-stress activities (typing) as it is when doing a single heavy-load activity (lifting).  RESTING POSITIONS Consider which positions are most painful for you when choosing a resting position. If you have pain with flexion-based activities (sitting, bending, stooping, squatting), choose a position that allows you to rest in a less flexed posture. You would want to avoid curling into a fetal position on your side. If your pain worsens with extension-based activities (prolonged standing, working overhead), avoid resting in an extended position such as sleeping on your stomach. Most people will find more comfort when they rest with their spine in a more neutral position, neither too rounded nor too arched. Lying on a non-sagging bed on your side with a pillow between your knees, or on your back with a pillow under your knees will often provide some relief. Keep in mind, being in any one position for a prolonged period of time, no matter how correct your posture, can still lead to stiffness.  PROPER SITTING POSTURE In order to minimize stress and discomfort on your spine, you must sit with correct posture. Sitting with good posture should be effortless for a healthy body. Returning to good posture is a gradual process. Many people can work toward this  most comfortably by using various supports until they have the flexibility and strength to maintain this posture on their own. When sitting with proper posture, your ears will fall over your shoulders and your shoulders will fall over your hips. You should use the back of the chair to support your upper back. Your lower back will be in a neutral position, just slightly arched. You may place a small pillow or folded towel at the base of your lower back for  support.  When working at a desk, create an environment that supports good, upright posture. Without extra support, muscles tire, which leads to excessive strain on joints and other tissues. Keep these recommendations in mind:  CHAIR:  A chair should be able to slide under your desk when your back makes contact with the back of the chair. This allows you to work closely.  The chair's height should allow your eyes to be level with the upper part of your monitor and your hands to be slightly lower than your elbows.  BODY POSITION  Your feet should make contact with the floor. If this is not possible, use a foot rest.  Keep your ears over your shoulders. This will reduce stress on your neck and low back.  INCORRECT SITTING POSTURES  If you are feeling tired and unable to assume a healthy sitting posture, do not slouch or slump. This puts excessive strain on your back tissues, causing more damage and pain. Healthier options include:  Using more support, like a lumbar pillow.  Switching tasks to something that requires you to be upright or walking.  Talking a brief walk.  Lying down to rest in a neutral-spine position.  PROLONGED STANDING WHILE SLIGHTLY LEANING FORWARD  When completing a task that requires you to lean forward while standing in one place for a long time, place either foot up on a stationary 2-4 inch high object to help maintain the best posture. When both feet are on the ground, the lower back tends to lose its slight inward  curve. If this curve flattens (or becomes too   large), then the back and your other joints will experience too much stress, tire more quickly, and can cause pain.  CORRECT STANDING POSTURES Proper standing posture should be assumed with all daily activities, even if they only take a few moments, like when brushing your teeth. As in sitting, your ears should fall over your shoulders and your shoulders should fall over your hips. You should keep a slight tension in your abdominal muscles to brace your spine. Your tailbone should point down to the ground, not behind your body, resulting in an over-extended swayback posture.   INCORRECT STANDING POSTURES  Common incorrect standing postures include a forward head, locked knees and/or an excessive swayback. WALKING Walk with an upright posture. Your ears, shoulders and hips should all line-up.  PROLONGED ACTIVITY IN A FLEXED POSITION When completing a task that requires you to bend forward at your waist or lean over a low surface, try to find a way to stabilize 3 out of 4 of your limbs. You can place a hand or elbow on your thigh or rest a knee on the surface you are reaching across. This will provide you more stability, so that your muscles do not tire as quickly. By keeping your knees relaxed, or slightly bent, you will also reduce stress across your lower back. CORRECT LIFTING TECHNIQUES  DO :  Assume a wide stance. This will provide you more stability and the opportunity to get as close as possible to the object which you are lifting.  Tense your abdominals to brace your spine. Bend at the knees and hips. Keeping your back locked in a neutral-spine position, lift using your leg muscles. Lift with your legs, keeping your back straight.  Test the weight of unknown objects before attempting to lift them.  Try to keep your elbows locked down at your sides in order get the best strength from your shoulders when carrying an object.     Always ask  for help when lifting heavy or awkward objects. INCORRECT LIFTING TECHNIQUES DO NOT:   Lock your knees when lifting, even if it is a small object.  Bend and twist. Pivot at your feet or move your feet when needing to change directions.  Assume that you can safely pick up even a paperclip without proper posture.   

## 2018-10-13 NOTE — Progress Notes (Signed)
Musculoskeletal Exam  Patient: Alyssa Warner DOB: May 24, 1961  DOS: 10/13/2018  SUBJECTIVE:  Chief Complaint:   Chief Complaint  Patient presents with  . Back Pain    Alyssa Warner is a 57 y.o.  female for evaluation and treatment of his back pain.   Onset:  2 days ago. Has been moving things around more often.   Location: lower Character:  burning  Progression of issue:  has moderately improved Associated symptoms: decreased ROM Denies bowel/bladder incontinence or weakness Treatment: to date has been ice, chiropractic and heat.   Neurovascular symptoms: no  Patient has a history of depression.  She is currently well controlled with Prozac 20 mg daily.  No thoughts of harming herself or others.  No side effects with the medication.  She feels it is working well and does not wish for dose change at this time.  ROS: Musculoskeletal/Extremities: +back pain Neurologic: no numbness, tingling no weakness   Past Medical History:  Diagnosis Date  . Depression   . Eczema   . Heart palpitations    Objective:   VITAL SIGNS: BP 130/80 (BP Location: Left Arm, Patient Position: Sitting, Cuff Size: Normal)   Pulse 67   Temp 98.1 F (36.7 C) (Oral)   Ht 5\' 5"  (1.651 m)   Wt 160 lb (72.6 kg)   SpO2 97%   BMI 26.63 kg/m  Constitutional: Well formed, well developed. No acute distress. HENT: Normocephalic, atraumatic.  Thorax & Lungs:  No accessory muscle use Extremities: No clubbing. No cyanosis. No edema.  Skin: Warm. Dry. No erythema. No rash.  Musculoskeletal: low back.   Tenderness to palpation: Mild tenderness to palpation over the lumbar erector spinal muscle group bilaterally Deformity: no Ecchymosis: no Straight leg test: negative for Poor hamstring flexibility b/l. Neurologic: Normal sensory function. No focal deficits noted. DTR's equal and symmetry in LE's. No clonus. Psychiatric: Normal mood. Age appropriate judgment and insight. Alert & oriented x 3.     Assessment:  Acute bilateral low back pain without sciatica  Depression, unspecified depression type - Plan: FLUoxetine (PROZAC) 20 MG capsule  Plan: Stretches and exercises for low back, heat, Tylenol, anti-inflammatories. Refill Prozac. F/u for physical at earliest convenience. The patient voiced understanding and agreement to the plan.   Jilda Roche [place], DO 10/13/18  2:38 PM

## 2018-11-25 ENCOUNTER — Ambulatory Visit (INDEPENDENT_AMBULATORY_CARE_PROVIDER_SITE_OTHER): Payer: 59 | Admitting: Family Medicine

## 2018-11-25 ENCOUNTER — Encounter: Payer: Self-pay | Admitting: Family Medicine

## 2018-11-25 VITALS — BP 108/68 | HR 64 | Temp 98.0°F | Ht 65.0 in | Wt 161.0 lb

## 2018-11-25 DIAGNOSIS — Z114 Encounter for screening for human immunodeficiency virus [HIV]: Secondary | ICD-10-CM

## 2018-11-25 DIAGNOSIS — Z23 Encounter for immunization: Secondary | ICD-10-CM | POA: Diagnosis not present

## 2018-11-25 DIAGNOSIS — Z0000 Encounter for general adult medical examination without abnormal findings: Secondary | ICD-10-CM

## 2018-11-25 DIAGNOSIS — Z1159 Encounter for screening for other viral diseases: Secondary | ICD-10-CM | POA: Diagnosis not present

## 2018-11-25 LAB — COMPREHENSIVE METABOLIC PANEL
ALBUMIN: 4.3 g/dL (ref 3.5–5.2)
ALK PHOS: 94 U/L (ref 39–117)
ALT: 19 U/L (ref 0–35)
AST: 19 U/L (ref 0–37)
BUN: 12 mg/dL (ref 6–23)
CO2: 31 mEq/L (ref 19–32)
Calcium: 9.4 mg/dL (ref 8.4–10.5)
Chloride: 102 mEq/L (ref 96–112)
Creatinine, Ser: 0.73 mg/dL (ref 0.40–1.20)
GFR: 87.85 mL/min (ref >60.00)
Glucose, Bld: 92 mg/dL (ref 70–99)
POTASSIUM: 4.3 meq/L (ref 3.5–5.1)
Sodium: 140 mEq/L (ref 135–145)
TOTAL PROTEIN: 7 g/dL (ref 6.0–8.3)
Total Bilirubin: 0.5 mg/dL (ref 0.2–1.2)

## 2018-11-25 LAB — TSH: TSH: 2.33 u[IU]/mL (ref 0.35–4.50)

## 2018-11-25 NOTE — Patient Instructions (Addendum)
Give us 2-3 business days to get the results of your labs back.   Call your gynecologist for your yearly visit.   Keep the diet clean and stay active.  Aim to do some physical exertion for 150 minutes per week. This is typically divided into 5 days per week, 30 minutes per day. The activity should be enough to get your heart rate up. Anything is better than nothing if you have time constraints. Consider weight lifting/resistance exercising and yoga.  Let us know if you need anything.

## 2018-11-25 NOTE — Progress Notes (Signed)
Chief Complaint  Patient presents with  . Annual Exam     Well Woman Alyssa Warner is here for a complete physical.   Her last physical was >1 year ago.  Current diet: in general, a "healthy" diet. Current exercise: walking. Weight is stable and she denies daytime fatigue. No LMP recorded. Patient is postmenopausal.  Seatbelt? Yes  Health Maintenance Pap/HPV- No Mammogram- getting one today Tetanus- Yes Hep C screening- No HIV screening- No  Past Medical History:  Diagnosis Date  . Depression   . Eczema   . Heart palpitations      Past Surgical History:  Procedure Laterality Date  . ANKLE SURGERY    . CHOLECYSTECTOMY    . MET Test  10/2012   low risk study; peak VO2 94% predicted; ischemic myocardial dysfunction   . TUBAL LIGATION      Medications  Current Outpatient Medications on File Prior to Visit  Medication Sig Dispense Refill  . atorvastatin (LIPITOR) 20 MG tablet Take 1 tablet (20 mg total) by mouth daily. 90 tablet 3  . FLUoxetine (PROZAC) 20 MG capsule TAKE 1 CAPSULE BY MOUTH EVERY DAY 90 capsule 0   Allergies No Known Allergies  Review of Systems: Constitutional:  no unexpected weight changes Eye:  no recent significant change in vision Ear/Nose/Mouth/Throat:  Ears:  no tinnitus or vertigo and no recent change in hearing Nose/Mouth/Throat:  no complaints of nasal congestion, no sore throat Cardiovascular: no chest pain Respiratory:  no cough and no shortness of breath Gastrointestinal:  no abdominal pain, no change in bowel habits GU:  Female: negative for dysuria or pelvic pain Musculoskeletal/Extremities:  no pain of the joints Integumentary (Skin/Breast):  no abnormal skin lesions reported Neurologic:  no headaches Endocrine:  denies fatigue Hematologic/Lymphatic:  No areas of easy bleeding  Exam BP 108/68 (BP Location: Left Arm, Patient Position: Sitting, Cuff Size: Normal)   Pulse 64   Temp 98 F (36.7 C) (Oral)   Ht _0  (1.651 m)    Wt 161 lb (73 kg)   SpO2 98%   BMI 26.79 kg/m  General:  well developed, well nourished, in no apparent distress Skin:  no significant moles, warts, or growths Head:  no masses, lesions, or tenderness Eyes:  pupils equal and round, sclera anicteric without injection Ears:  canals without lesions, TMs shiny without retraction, no obvious effusion, no erythema Nose:  nares patent, septum midline, mucosa normal, and no drainage or sinus tenderness Throat/Pharynx:  lips and gingiva without lesion; tongue and uvula midline; non-inflamed pharynx; no exudates or postnasal drainage Neck: neck supple without adenopathy, thyromegaly, or masses Lungs:  clear to auscultation, breath sounds equal bilaterally, no respiratory distress Cardio:  regular rate and rhythm, no bruits, no LE edema Abdomen:  abdomen soft, nontender; bowel sounds normal; no masses or organomegaly Genital: Defer to GYN Musculoskeletal:  symmetrical muscle groups noted without atrophy or deformity Extremities:  no clubbing, cyanosis, or edema, no deformities, no skin discoloration Neuro:  gait normal; deep tendon reflexes normal and symmetric Psych: well oriented with normal range of affect and appropriate judgment/insight  Assessment and Plan  Well adult exam - Plan: Comprehensive metabolic panel, TSH  Screening for HIV (human immunodeficiency virus) - Plan: HIV Antibody (routine testing w rflx)  Encounter for hepatitis C screening test for low risk patient - Plan: Hepatitis C antibody   Well 57 y.o. female. Counseled on diet and exercise. Challenged to lift wts. Contact GYN for Pap smear. Other orders  as above. Follow up in 6 mo. The patient voiced understanding and agreement to the plan.  [place], DO 11/25/18 8:05 AM

## 2018-11-25 NOTE — Progress Notes (Signed)
Pre visit review using our clinic review tool, if applicable. No additional management support is needed unless otherwise documented below in the visit note. 

## 2018-11-25 NOTE — Addendum Note (Signed)
Addended by: Scharlene GlossEWING, Charolette Bultman B on: 11/25/2018 08:10 AM   Modules accepted: Orders

## 2018-11-26 LAB — HEPATITIS C ANTIBODY
Hepatitis C Ab: NONREACTIVE
SIGNAL TO CUT-OFF: 0.01 (ref <1.00)

## 2018-11-26 LAB — HIV ANTIBODY (ROUTINE TESTING W REFLEX): HIV 1&2 Ab, 4th Generation: NONREACTIVE

## 2019-01-09 ENCOUNTER — Other Ambulatory Visit: Payer: Self-pay | Admitting: Family Medicine

## 2019-01-09 DIAGNOSIS — F329 Major depressive disorder, single episode, unspecified: Secondary | ICD-10-CM

## 2019-01-09 DIAGNOSIS — F32A Depression, unspecified: Secondary | ICD-10-CM

## 2019-03-11 ENCOUNTER — Encounter: Payer: Self-pay | Admitting: Family Medicine

## 2019-03-11 ENCOUNTER — Telehealth: Payer: Self-pay | Admitting: Family Medicine

## 2019-03-11 ENCOUNTER — Ambulatory Visit: Payer: 59 | Admitting: Family Medicine

## 2019-03-11 ENCOUNTER — Other Ambulatory Visit: Payer: Self-pay

## 2019-03-11 NOTE — Telephone Encounter (Signed)
Copied from CRM [id number]. Topic: General - Inquiry >> Mar 11, 2019  8:04 AM Crist Infante wrote: Reason for CRM: pt states she sent a mychart message this am with a picture of her toe she stumped.  Pt thinks it is dislocated, and wonder if anything you can do?  She say she heard it pop when she stumped it, and now it sticks out to the side.  Pt hopes to get a call back to advise.  Scheduled at 4:15 today

## 2019-05-03 ENCOUNTER — Other Ambulatory Visit: Payer: Self-pay | Admitting: Family Medicine

## 2019-05-03 DIAGNOSIS — Z0000 Encounter for general adult medical examination without abnormal findings: Secondary | ICD-10-CM

## 2019-05-05 ENCOUNTER — Other Ambulatory Visit: Payer: Self-pay

## 2019-05-05 ENCOUNTER — Other Ambulatory Visit (INDEPENDENT_AMBULATORY_CARE_PROVIDER_SITE_OTHER): Payer: 59

## 2019-05-05 DIAGNOSIS — Z0000 Encounter for general adult medical examination without abnormal findings: Secondary | ICD-10-CM | POA: Diagnosis not present

## 2019-05-05 LAB — CBC
HCT: 38.9 % (ref 36.0–46.0)
Hemoglobin: 13.4 g/dL (ref 12.0–15.0)
MCHC: 34.5 g/dL (ref 30.0–36.0)
MCV: 86 fl (ref 78.0–100.0)
Platelets: 249 10*3/uL (ref 150.0–400.0)
RBC: 4.53 Mil/uL (ref 3.87–5.11)
RDW: 13.4 % (ref 11.5–15.5)
WBC: 5 10*3/uL (ref 4.0–10.5)

## 2019-05-05 LAB — COMPREHENSIVE METABOLIC PANEL
ALT: 20 U/L (ref 0–35)
AST: 18 U/L (ref 0–37)
Albumin: 3.9 g/dL (ref 3.5–5.2)
Alkaline Phosphatase: 89 U/L (ref 39–117)
BUN: 14 mg/dL (ref 6–23)
CO2: 31 mEq/L (ref 19–32)
Calcium: 8.9 mg/dL (ref 8.4–10.5)
Chloride: 105 mEq/L (ref 96–112)
Creatinine, Ser: 0.75 mg/dL (ref 0.40–1.20)
GFR: 79.99 mL/min (ref >60.00)
Glucose, Bld: 96 mg/dL (ref 70–99)
Potassium: 4.3 mEq/L (ref 3.5–5.1)
Sodium: 142 mEq/L (ref 135–145)
Total Bilirubin: 0.4 mg/dL (ref 0.2–1.2)
Total Protein: 6.4 g/dL (ref 6.0–8.3)

## 2019-05-05 LAB — LIPID PANEL
Cholesterol: 177 mg/dL (ref 0–200)
HDL: 64.7 mg/dL (ref >39.00)
LDL Cholesterol: 99 mg/dL (ref 0–99)
NonHDL: 112.54
Total CHOL/HDL Ratio: 3
Triglycerides: 68 mg/dL (ref 0.0–149.0)
VLDL: 13.6 mg/dL (ref 0.0–40.0)

## 2019-05-15 ENCOUNTER — Other Ambulatory Visit: Payer: Self-pay | Admitting: Family Medicine

## 2019-05-15 DIAGNOSIS — F32A Depression, unspecified: Secondary | ICD-10-CM

## 2019-05-15 DIAGNOSIS — F329 Major depressive disorder, single episode, unspecified: Secondary | ICD-10-CM

## 2019-05-22 ENCOUNTER — Encounter: Payer: Self-pay | Admitting: Family Medicine

## 2019-05-24 ENCOUNTER — Other Ambulatory Visit: Payer: Self-pay | Admitting: Family Medicine

## 2019-05-24 MED ORDER — CLOBETASOL PROPIONATE 0.05 % EX SOLN: 1.0000 "application " | Freq: Two times a day (BID) | CUTANEOUS | 1 refills | Status: DC

## 2019-05-25 ENCOUNTER — Ambulatory Visit: Payer: 59 | Admitting: Family Medicine

## 2019-07-04 ENCOUNTER — Other Ambulatory Visit: Payer: Self-pay

## 2019-07-04 ENCOUNTER — Ambulatory Visit (INDEPENDENT_AMBULATORY_CARE_PROVIDER_SITE_OTHER): Payer: 59 | Admitting: Family Medicine

## 2019-07-04 ENCOUNTER — Encounter: Payer: Self-pay | Admitting: Family Medicine

## 2019-07-04 VITALS — BP 102/68 | HR 81 | Temp 98.0°F | Ht 65.0 in | Wt 160.1 lb

## 2019-07-04 DIAGNOSIS — F329 Major depressive disorder, single episode, unspecified: Secondary | ICD-10-CM

## 2019-07-04 DIAGNOSIS — E785 Hyperlipidemia, unspecified: Secondary | ICD-10-CM

## 2019-07-04 DIAGNOSIS — F32A Depression, unspecified: Secondary | ICD-10-CM

## 2019-07-04 MED ORDER — ATORVASTATIN CALCIUM 20 MG PO TABS: 20.0000 mg | ORAL_TABLET | Freq: Every day | ORAL | 2 refills | Status: DC

## 2019-07-04 MED ORDER — FLUOXETINE HCL 20 MG PO CAPS: 20.0000 mg | ORAL_CAPSULE | Freq: Every day | ORAL | 2 refills | Status: DC

## 2019-07-04 NOTE — Progress Notes (Signed)
Chief Complaint  Patient presents with  . Follow-up    Subjective Alyssa Warner presents for f/u anxiety/depression.  She is currently being treated with Prozac 20 mg/d.  Reports good effect since treatment. No thoughts of harming self or others. No self-medication with alcohol, prescription drugs or illicit drugs. Pt is not following with a counselor/psychologist.  Hyperlipidemia Patient presents for dyslipidemia follow up. Currently being treated with atorvastatin 20 mg/d and compliance with treatment thus far has been good. She denies myalgias. She is adhering to a healthy diet. Exercise: walking The patient is not known to have coexisting coronary artery disease.  ROS Psych: No homicidal or suicidal thoughts . Past Medical History:  Diagnosis Date  . Depression   . Eczema   . Heart palpitations    Exam BP 102/68 (BP Location: Left Arm, Patient Position: Sitting, Cuff Size: Normal)   Pulse 81   Temp 98 F (36.7 C) (Oral)   Ht 5\' 5"  (1.651 m)   Wt 160 lb 2 oz (72.6 kg)   SpO2 96%   BMI 26.65 kg/m  General:  well developed, well nourished, in no apparent distress Neck: neck supple without adenopathy, thyromegaly, or masses Lungs:  clear to auscultation, breath sounds equal bilaterally, no respiratory distress Cardio:  regular rate and rhythm without murmurs, heart sounds without clicks or rubs Psych: well oriented with normal range of affect and age-appropriate judgement/insight, alert and oriented x4.  Assessment and Plan  Depression, unspecified depression type - Plan: FLUoxetine (PROZAC) 20 MG capsule  Hyperlipidemia: Cont statin  Orders as above. F/u in 6 mo for CPE or prn. The patient voiced understanding and agreement to the plan.  [place], DO 07/04/19 4:06 PM

## 2019-07-04 NOTE — Patient Instructions (Signed)
Keep the diet clean and stay active. Consider yoga/wt resistance exercise.  Find out when your last pap smear/cervical cancer screening was.  Let us know if you need anything.

## 2019-07-25 ENCOUNTER — Other Ambulatory Visit: Payer: Self-pay

## 2019-07-25 ENCOUNTER — Ambulatory Visit (INDEPENDENT_AMBULATORY_CARE_PROVIDER_SITE_OTHER): Payer: 59 | Admitting: Family Medicine

## 2019-07-25 ENCOUNTER — Encounter: Payer: Self-pay | Admitting: Family Medicine

## 2019-07-25 DIAGNOSIS — Z20828 Contact with and (suspected) exposure to other viral communicable diseases: Secondary | ICD-10-CM

## 2019-07-25 DIAGNOSIS — Z20822 Contact with and (suspected) exposure to covid-19: Secondary | ICD-10-CM

## 2019-07-25 NOTE — Progress Notes (Signed)
lab

## 2019-07-25 NOTE — Progress Notes (Signed)
CC: Exposure to [place] here for URI complaints. Due to COVID-19 pandemic, we are interacting via web portal for an electronic face-to-face visit. I verified patient's ID using 2 identifiers. Patient agreed to proceed with visit via this method. Patient is at home, I am at office. Patient, her husband and I are present for visit.   8 days ago, they were around her son.  Little did he know, his roommates tested positive for COVID.  Later in the week, the patient started having symptoms like a fever.  He was tested and 3 days ago found he was positive as well.  He did not have any symptoms when he was near the patient or her husband.  The patient, nor her husband, her having any symptoms such as nausea, vomiting, abdominal pain, diarrhea, muscle aches, shortness of breath, coughing, fevers, wheezing, or upper respiratory symptoms.  ROS:  Const: Denies fevers HEENT: As noted in HPI Lungs: No SOB  Past Medical History:  Diagnosis Date  . Depression   . Eczema   . Heart palpitations    Exam No conversational dyspnea Age appropriate judgment and insight Nml affect and mood  Exposure to Covid-19 Virus - Plan: Novel Coronavirus, NAA ([facility])  Will ck abov. F/u prn.  Pt voiced understanding and agreement to the plan.  [place], DO 07/25/19 4:44 PM

## 2019-07-26 ENCOUNTER — Other Ambulatory Visit: Payer: Self-pay

## 2019-07-26 DIAGNOSIS — Z20822 Contact with and (suspected) exposure to covid-19: Secondary | ICD-10-CM

## 2019-07-27 LAB — NOVEL CORONAVIRUS, NAA: SARS-CoV-2, NAA: NOT DETECTED

## 2019-08-31 ENCOUNTER — Other Ambulatory Visit: Payer: Self-pay

## 2019-08-31 ENCOUNTER — Ambulatory Visit (INDEPENDENT_AMBULATORY_CARE_PROVIDER_SITE_OTHER): Payer: 59

## 2019-08-31 DIAGNOSIS — Z23 Encounter for immunization: Secondary | ICD-10-CM | POA: Diagnosis not present

## 2020-01-11 ENCOUNTER — Other Ambulatory Visit: Payer: Self-pay

## 2020-01-11 ENCOUNTER — Encounter: Payer: Self-pay | Admitting: Internal Medicine

## 2020-01-11 ENCOUNTER — Ambulatory Visit (INDEPENDENT_AMBULATORY_CARE_PROVIDER_SITE_OTHER): Payer: 59 | Admitting: Internal Medicine

## 2020-01-11 VITALS — BP 135/69 | HR 63 | Temp 97.5°F | Ht 65.0 in | Wt 166.0 lb

## 2020-01-11 DIAGNOSIS — E785 Hyperlipidemia, unspecified: Secondary | ICD-10-CM | POA: Diagnosis not present

## 2020-01-11 DIAGNOSIS — R002 Palpitations: Secondary | ICD-10-CM

## 2020-01-11 DIAGNOSIS — R55 Syncope and collapse: Secondary | ICD-10-CM

## 2020-01-11 MED ORDER — ATORVASTATIN CALCIUM 20 MG PO TABS: 20.0000 mg | ORAL_TABLET | Freq: Every day | ORAL | 3 refills | Status: DC

## 2020-01-11 NOTE — Patient Instructions (Signed)
Follow-Up: At [facility], you and your health needs are our priority.  As part of our continuing mission to provide you with exceptional heart care, we have created designated Provider Care Teams.  These Care Teams include your primary Cardiologist (physician) and Advanced Practice Providers (APPs -  Physician Assistants and Nurse Practitioners) who all work together to provide you with the care you need, when you need it.  Your next appointment:    AS NEEDED  Provider:   You may see Chrystie Nose, MD or one of the following Advanced Practice Providers on your designated Care Team:    Azalee Course, PA-C  Micah Flesher, PA-C or   Judy Pimple, [place]

## 2020-01-11 NOTE — Progress Notes (Signed)
OFFICE NOTE  Chief Complaint:  Follow-up  Primary Care Physician: Shelda Pal, DO  HPI:  Alyssa Warner is a 59 year-old female who was sent to me for palpitations and was found to have some PVCs many years ago. Recently has had some fluttering and underwent a 7-day monitor which showed no evidence of extrasystoles. In fact, she had 2 episodes of heart racing which correlated with a sinus rhythm at a rate of about 70. She did have some mild sinus tachycardia and heart rate varied between 50 and 130 over the week of the study. No significant abnormalities were noted. I also had her undergo metabolic testing due to occasional exertional chest pressure. She did very well with that MET-test that was performed on October 18, 2012, showed a normal exercise effort with a peak VO2 of 94%. Max predicted heart rate was slightly higher at 89% with a marked inflection after anaerobic threshold. There was a mild ischemic response with flattening of the VO2 curve; however, this is still a low risk study and I suspect could relate to microvascular dysfunction or some other abnormality at high rates of exercise. Overall, the treatment for this is increasing her exercise. I have given her exercise prescription for better cardiovascular conditioning. One could consider beta blockade; however, I do not think there is a clear indication and her chest fluttering has improved. Finally, we talked about addition of either CoQ10 or L-Arginine to her diet, both of which may be helpful in improving some of her ischemic dysfunction.  Alyssa Warner returns today for followup. She reports that recently she's been exercising and has noted some increasing shortness of breath with exertion. She says that she had cholesterol testing done in January of 2015. I have results of that which indicate total cholesterol 279, triglycerides 57, HDL 70 and LDL 198. Based on this she was started on low-dose Lipitor 10 mg  daily. She denies any true chest pain.  She reported still having problems with palpitations. And there was some shortness of breath with exercise. At her last visit I recommended starting low-dose Toprol. She seems to take that medication without any problems. She underwent repeat metabolic testing. This demonstrated improved exercise tolerance with a stable VO2 of 91%.  Alyssa Warner returns today for follow-up. Overall she is doing well. She denies any significant shortness of breath. She very occasionally gets palpitations but feels like they're well controlled on her beta blocker. Blood pressure is low today at 90/60 however she is asymptomatic. She says she rarely checks heart rate or blood pressure. She denies any presyncope, worsening fatigue or other symptoms and may be associated with low blood pressure.  07/24/2016  Alyssa Warner returns today for follow-up. She reports very infrequent palpitations that are generally well controlled. She denies any worsening shortness of breath with exertion. She has recently bought a bicycle and hurt her husband exercise regularly. They also have a beach house which they are going to be going to more regularly as well. Cholesterol is due for reassessment but was well controlled last year. She denies any chest pain.  02/03/2017  Alyssa Warner returns today for follow-up. She was recently seen in the emergency department with headache. She's recently been underlined stress dealing with an attorney and she has had subsequent headaches. I suspect they are tension headaches. She described pain in the back of her neck in her head which was very sharp. She denied any our or associated nausea, photosensitivity or  a photosensitivity or visual sensations suggestive of migraine. She did note however during one episode the blood pressure was elevated 200/70 when she was in the emergency department. They did not give her any medications but performed a head CT scan which  showed no evidence of stroke although there was age advanced atherosclerosis. After further discussion she's had other headache episodes for which she checked her blood pressure and was only elevated to 135, suggesting that the hypertension was not the cause of her headache, but more likely a result of her pain. She also reported me today that she does not previously taking her Lipitor routinely. We last assessed her cholesterol she was only using the Lipitor sporadically. Since December she says she has been taking Lipitor on a daily basis.  09/08/2018  Alyssa Warner is seen today in follow-up.  She reports that her stress is improved significantly.  Her headaches have improved as well.  She did stop her beta-blocker and notes that she has been having palpitations which she is aware of.  She does not think that they were necessarily improved on the beta-blocker.  Blood pressure is normal today.  She does recall one episode of syncope while she was on an airplane and another episode of presyncope when she was playing pickle ball.  Both of these episodes were preceded by nausea, mild clamminess, dizziness and a clear prodrome suggestive of vasovagal syncope.  These episodes were while taking her beta-blocker.  She denies any chest pain or worsening shortness of breath however she is not exercising regularly.  01/11/2020  Alyssa Warner returns for follow-up.  She denies any further episodes of dizziness or presyncope.  She remains on low-dose atorvastatin with good cholesterol control.  EKG today shows sinus rhythm with some arrhythmia and incomplete right bundle branch block at 63.  Blood pressure is reasonably controlled 135/69.   PMHx:  Past Medical History:  Diagnosis Date  . Depression   . Eczema   . Heart palpitations     Past Surgical History:  Procedure Laterality Date  . ANKLE SURGERY    . CHOLECYSTECTOMY    . MET Test  10/2012   low risk study; peak VO2 94% predicted; ischemic  myocardial dysfunction   . TUBAL LIGATION      FAMHx:  Family History  Problem Relation Age of Onset  . Breast cancer Mother   . Kidney disease Mother   . Hypertension Father   . Arrhythmia Father   . Heart disease Father   . Arrhythmia Brother        pacemaker    SOCHx:   reports that she quit smoking about 35 years ago. She has never used smokeless tobacco. She reports current alcohol use. She reports that she does not use drugs.  ALLERGIES:  No Known Allergies  ROS: Pertinent items noted in HPI and remainder of comprehensive ROS otherwise negative.  HOME MEDS: Current Outpatient Medications  Medication Sig Dispense Refill  . atorvastatin (LIPITOR) 20 MG tablet Take 1 tablet (20 mg total) by mouth daily. 90 tablet 2  . atorvastatin (LIPITOR) 20 MG tablet Take by mouth.    . calcium-vitamin D (OSCAL WITH D) 500-200 MG-UNIT TABS tablet Take by mouth.    . clobetasol (TEMOVATE) 0.05 % external solution Apply 1 application topically 2 (two) times daily. 50 mL 1  . clobetasol (TEMOVATE) 0.05 % external solution     . FLUoxetine (PROZAC) 20 MG capsule Take 1 capsule (20 mg total) by mouth daily.  90 capsule 2  . FLUoxetine (PROZAC) 20 MG capsule      No current facility-administered medications for this visit.    LABS/IMAGING: No results found for this or any previous visit (from the past 48 hour(s)). No results found.  VITALS: BP 135/69   Pulse 63   Temp (!) 97.5 F (36.4 C)   Ht '5\' 5"'  (1.651 m)   Wt 166 lb (75.3 kg)   SpO2 100%   BMI 27.62 kg/m   EXAM: General appearance: alert and no distress Neck: no carotid bruit and no JVD Lungs: clear to auscultation bilaterally Heart: regular rate and rhythm, S1, S2 normal, no murmur, click, rub or gallop Abdomen: soft, non-tender; bowel sounds normal; no masses,  no organomegaly Extremities: extremities normal, atraumatic, no cyanosis or edema Pulses: 2+ and symmetric Skin: Skin color, texture, turgor normal. No  rashes or lesions Neurologic: Grossly normal Psych: Normal  EKG: Normal sinus rhythm with sinus arrhythmia at 63, incomplete right bundle branch block-personally reviewed  ASSESSMENT: 1. Vasovagal syncope 2. Tension headaches 3. Palpitations 4. Dyslipidemia-on Lipitor  PLAN: 1.   Alyssa Warner is doing well without any recurrent syncope.  She denies any palpitations.  Her PCP is managing her dyslipidemia.  Blood pressure is well controlled.  Plan follow-up with me as needed.  Pixie Casino, MD, [facility], [place] Director of the [facility]  [facility] Diplomate of the American Board of Clinical Lipidology Attending Cardiologist  Direct Dial: [phone]  Fax: [phone]  Website:  [email or web address] Dains 01/11/2020, 10:33 AM

## 2020-01-12 ENCOUNTER — Encounter: Payer: Self-pay | Admitting: Internal Medicine

## 2020-01-24 ENCOUNTER — Encounter: Payer: 59 | Admitting: Family Medicine

## 2020-02-23 ENCOUNTER — Other Ambulatory Visit: Payer: Self-pay

## 2020-02-24 ENCOUNTER — Ambulatory Visit (INDEPENDENT_AMBULATORY_CARE_PROVIDER_SITE_OTHER): Payer: 59 | Admitting: Family Medicine

## 2020-02-24 ENCOUNTER — Encounter: Payer: Self-pay | Admitting: Family Medicine

## 2020-02-24 VITALS — BP 120/80 | HR 70 | Temp 97.0°F | Ht 65.0 in | Wt 162.4 lb

## 2020-02-24 DIAGNOSIS — Z0000 Encounter for general adult medical examination without abnormal findings: Secondary | ICD-10-CM | POA: Diagnosis not present

## 2020-02-24 DIAGNOSIS — F329 Major depressive disorder, single episode, unspecified: Secondary | ICD-10-CM

## 2020-02-24 DIAGNOSIS — F32A Depression, unspecified: Secondary | ICD-10-CM

## 2020-02-24 LAB — COMPREHENSIVE METABOLIC PANEL
ALT: 19 U/L (ref 0–35)
AST: 20 U/L (ref 0–37)
Albumin: 4.2 g/dL (ref 3.5–5.2)
Alkaline Phosphatase: 105 U/L (ref 39–117)
BUN: 14 mg/dL (ref 6–23)
CO2: 30 mEq/L (ref 19–32)
Calcium: 9.4 mg/dL (ref 8.4–10.5)
Chloride: 103 mEq/L (ref 96–112)
Creatinine, Ser: 0.81 mg/dL (ref 0.40–1.20)
GFR: 72.98 mL/min (ref >60.00)
Glucose, Bld: 97 mg/dL (ref 70–99)
Potassium: 5.1 mEq/L (ref 3.5–5.1)
Sodium: 140 mEq/L (ref 135–145)
Total Bilirubin: 0.4 mg/dL (ref 0.2–1.2)
Total Protein: 7 g/dL (ref 6.0–8.3)

## 2020-02-24 LAB — CBC
HCT: 41 % (ref 36.0–46.0)
Hemoglobin: 13.9 g/dL (ref 12.0–15.0)
MCHC: 33.8 g/dL (ref 30.0–36.0)
MCV: 85.8 fl (ref 78.0–100.0)
Platelets: 259 10*3/uL (ref 150.0–400.0)
RBC: 4.78 Mil/uL (ref 3.87–5.11)
RDW: 13.3 % (ref 11.5–15.5)
WBC: 4.9 10*3/uL (ref 4.0–10.5)

## 2020-02-24 LAB — LIPID PANEL
Cholesterol: 176 mg/dL (ref 0–200)
HDL: 74.6 mg/dL (ref >39.00)
LDL Cholesterol: 90 mg/dL (ref 0–99)
NonHDL: 101.39
Total CHOL/HDL Ratio: 2
Triglycerides: 56 mg/dL (ref 0.0–149.0)
VLDL: 11.2 mg/dL (ref 0.0–40.0)

## 2020-02-24 MED ORDER — FLUOXETINE HCL 20 MG PO CAPS: 20.0000 mg | ORAL_CAPSULE | Freq: Every day | ORAL | 2 refills | Status: DC

## 2020-02-24 NOTE — Progress Notes (Signed)
Chief Complaint  Patient presents with  . Annual Exam     Well Woman Alyssa Warner is here for a complete physical.   Her last physical was >1 year ago.  Current diet: in general, a "healthy" diet. Current exercise: walking, pilates, weights. Weight is stable and she denies daytime fatigue. Seatbelt? Yes  Health Maintenance Pap/HPV- No Mammogram- Yes Colon cancer screening-Yes Shingrix- No Tetanus- Yes Hep C screening- Yes HIV screening- Yes  Past Medical History:  Diagnosis Date  . Depression   . Eczema   . Heart palpitations      Past Surgical History:  Procedure Laterality Date  . ANKLE SURGERY    . CHOLECYSTECTOMY    . MET Test  10/2012   low risk study; peak VO2 94% predicted; ischemic myocardial dysfunction   . TUBAL LIGATION      Medications  Current Outpatient Medications on File Prior to Visit  Medication Sig Dispense Refill  . atorvastatin (LIPITOR) 20 MG tablet Take 1 tablet (20 mg total) by mouth daily at 6 PM. 90 tablet 3  . calcium-vitamin D (OSCAL WITH D) 500-200 MG-UNIT TABS tablet Take by mouth.     Allergies No Known Allergies  Review of Systems: Constitutional:  no unexpected weight changes Eye:  no recent significant change in vision Ear/Nose/Mouth/Throat:  Ears:  no recent change in hearing Nose/Mouth/Throat:  no complaints of nasal congestion, no sore throat Cardiovascular: no chest pain Respiratory:  no shortness of breath Gastrointestinal:  no abdominal pain, no change in bowel habits GU:  Female: negative for dysuria or pelvic pain Musculoskeletal/Extremities:  no pain of the joints Integumentary (Skin/Breast):  no abnormal skin lesions reported Neurologic:  no headaches Endocrine:  denies fatigue Hematologic/Lymphatic:  No areas of easy bleeding  Exam BP 120/80 (BP Location: Left Arm, Patient Position: Sitting, Cuff Size: Normal)   Pulse 70   Temp (!) 97 F (36.1 C) (Temporal)   Ht _0  (1.651 m)   Wt 162 lb 6 oz (73.7  kg)   SpO2 97%   BMI 27.02 kg/m  General:  well developed, well nourished, in no apparent distress Skin:  no significant moles, warts, or growths Head:  no masses, lesions, or tenderness Eyes:  pupils equal and round, sclera anicteric without injection Ears:  canals without lesions, TMs shiny without retraction, no obvious effusion, no erythema Nose:  nares patent, septum midline, mucosa normal, and no drainage or sinus tenderness Throat/Pharynx:  lips and gingiva without lesion; tongue and uvula midline; non-inflamed pharynx; no exudates or postnasal drainage Neck: neck supple without adenopathy, thyromegaly, or masses Lungs:  clear to auscultation, breath sounds equal bilaterally, no respiratory distress Cardio:  regular rate and rhythm, no LE edema Abdomen:  abdomen soft, nontender; bowel sounds normal; no masses or organomegaly Genital: Defer to GYN Musculoskeletal:  symmetrical muscle groups noted without atrophy or deformity Extremities:  no clubbing, cyanosis, or edema, no deformities, no skin discoloration Neuro:  gait normal; deep tendon reflexes normal and symmetric Psych: well oriented with normal range of affect and appropriate judgment/insight  Assessment and Plan  Well adult exam - Plan: CBC, Comprehensive metabolic panel, Lipid panel  Depression, unspecified depression type - Plan: FLUoxetine (PROZAC) 20 MG capsule   Well 59 y.o. female. Counseled on diet and exercise. Other orders as above. Follow up in 6 mo or prn. The patient voiced understanding and agreement to the plan.  [place], DO 02/24/20 9:52 AM

## 2020-02-24 NOTE — Patient Instructions (Addendum)
Give [place] 2-3 business days to get the results of your labs back.   Keep the diet clean and stay active.  The new Shingrix vaccine (for shingles) is a 2 shot series. It can make people feel low energy, achy and almost like they have the flu for 48 hours after injection. Please plan accordingly when deciding on when to get this shot. Call our office for a nurse visit appointment to get this. The second shot of the series is less severe regarding the side effects, but it still lasts 48 hours.   Wait 2 weeks after the second covid vaccine before getting any vaccine.   Let us know if you need anything.

## 2020-04-09 ENCOUNTER — Telehealth: Payer: Self-pay

## 2020-04-09 NOTE — Telephone Encounter (Signed)
Cologuard ordered

## 2020-04-22 ENCOUNTER — Other Ambulatory Visit: Payer: Self-pay | Admitting: Family Medicine

## 2020-04-22 DIAGNOSIS — F32A Depression, unspecified: Secondary | ICD-10-CM

## 2020-05-08 LAB — COLOGUARD: Cologuard: NEGATIVE

## 2020-05-28 ENCOUNTER — Encounter: Payer: Self-pay | Admitting: Family Medicine

## 2020-08-28 ENCOUNTER — Ambulatory Visit: Payer: 59 | Admitting: Family Medicine

## 2020-09-05 ENCOUNTER — Encounter: Payer: Self-pay | Admitting: Family Medicine

## 2020-09-05 ENCOUNTER — Ambulatory Visit (INDEPENDENT_AMBULATORY_CARE_PROVIDER_SITE_OTHER): Payer: 59 | Admitting: Family Medicine

## 2020-09-05 ENCOUNTER — Other Ambulatory Visit: Payer: Self-pay

## 2020-09-05 VITALS — BP 108/62 | HR 80 | Temp 98.4°F | Ht 65.0 in | Wt 165.5 lb

## 2020-09-05 DIAGNOSIS — F329 Major depressive disorder, single episode, unspecified: Secondary | ICD-10-CM | POA: Diagnosis not present

## 2020-09-05 DIAGNOSIS — Z23 Encounter for immunization: Secondary | ICD-10-CM | POA: Diagnosis not present

## 2020-09-05 DIAGNOSIS — F32A Depression, unspecified: Secondary | ICD-10-CM

## 2020-09-05 DIAGNOSIS — M21612 Bunion of left foot: Secondary | ICD-10-CM

## 2020-09-05 DIAGNOSIS — E785 Hyperlipidemia, unspecified: Secondary | ICD-10-CM | POA: Diagnosis not present

## 2020-09-05 MED ORDER — VENLAFAXINE HCL ER 75 MG PO CP24: 75.0000 mg | ORAL_CAPSULE | Freq: Every day | ORAL | 3 refills | Status: DC

## 2020-09-05 NOTE — Patient Instructions (Addendum)
Give us 2-3 business days to get the results of your labs back.   Keep the diet clean and stay active.  If you do not hear anything about your referral in the next 1-2 weeks, call our office and ask for an update.  Let us know if you need anything. 

## 2020-09-05 NOTE — Progress Notes (Signed)
Chief Complaint  Patient presents with  . Follow-up    6 month    Subjective Alyssa Warner presents for f/u anxiety/depression.  Pt is currently being treated with Prozac 20 mg/d.  Reports worsening since treatment. She has anhedonia and tearfulness despite taking Prozac.  No thoughts of harming self or others. No self-medication with alcohol, prescription drugs or illicit drugs. Pt is not following with a counselor/psychologist.  Hyperlipidemia Patient presents for dyslipidemia follow up. Currently being treated with Lipitor 20 mg/d and compliance with treatment thus far has been good. She denies myalgias. She is adhering to a healthy diet. Exercise: pickleball The patient is not known to have coexisting coronary artery disease.  Past Medical History:  Diagnosis Date  . Depression   . Eczema   . Heart palpitations    Allergies as of 09/05/2020   No Known Allergies     Medication List       Accurate as of September 05, 2020  9:32 AM. If you have any questions, ask your nurse or doctor.        STOP taking these medications   FLUoxetine 20 MG capsule Commonly known as: PROZAC Stopped by: Sharlene Dory, DO     TAKE these medications   atorvastatin 20 MG tablet Commonly known as: LIPITOR Take 1 tablet (20 mg total) by mouth daily at 6 PM.   calcium-vitamin D 500-200 MG-UNIT Tabs tablet Commonly known as: OSCAL WITH D Take by mouth.   venlafaxine XR 75 MG 24 hr capsule Commonly known as: Effexor XR Take 1 capsule (75 mg total) by mouth daily with breakfast. Started by: Sharlene Dory, DO       Exam BP 108/62 (BP Location: Left Arm, Patient Position: Sitting, Cuff Size: Normal)   Pulse 80   Temp 98.4 F (36.9 C) (Oral)   Ht 5\' 5"  (1.651 m)   Wt 165 lb 8 oz (75.1 kg)   SpO2 96%   BMI 27.54 kg/m  General:  well developed, well nourished, in no apparent distress Lungs:  No respiratory distress Psych: well oriented with normal range  of affect and age-appropriate judgement/insight, alert and oriented x4.  Assessment and Plan  Depression, unspecified depression type  Hyperlipidemia, unspecified hyperlipidemia type - Plan: Comprehensive metabolic panel, Lipid panel  Need for influenza vaccination - Plan: Flu Vaccine QUAD 6+ mos PF IM (Fluarix Quad PF)  Bunion of left foot  1. Change Prozac to Effexor XR 75 mg/d. LB BH info given. 2. Cont Lipitor 20 mg/d, ck lipids today.  F/u in in 6 weeks, e visit, to reck #1. The patient voiced understanding and agreement to the plan.  [place], DO 09/05/20 9:32 AM

## 2020-09-06 LAB — LIPID PANEL
Cholesterol: 170 mg/dL (ref <200)
HDL: 79 mg/dL (ref >=50)
LDL Cholesterol (Calc): 73 mg/dL (calc)
Non-HDL Cholesterol (Calc): 91 mg/dL (calc) (ref <130)
Total CHOL/HDL Ratio: 2.2 (calc) (ref <5.0)
Triglycerides: 96 mg/dL (ref <150)

## 2020-09-06 LAB — COMPREHENSIVE METABOLIC PANEL
AG Ratio: 1.8 (calc) (ref 1.0–2.5)
ALT: 19 U/L (ref 6–29)
AST: 20 U/L (ref 10–35)
Albumin: 4.4 g/dL (ref 3.6–5.1)
Alkaline phosphatase (APISO): 92 U/L (ref 37–153)
BUN: 11 mg/dL (ref 7–25)
CO2: 29 mmol/L (ref 20–32)
Calcium: 9.7 mg/dL (ref 8.6–10.4)
Chloride: 103 mmol/L (ref 98–110)
Creat: 0.88 mg/dL (ref 0.50–1.05)
Globulin: 2.4 g/dL (calc) (ref 1.9–3.7)
Glucose, Bld: 97 mg/dL (ref 65–99)
Potassium: 4.8 mmol/L (ref 3.5–5.3)
Sodium: 140 mmol/L (ref 135–146)
Total Bilirubin: 0.6 mg/dL (ref 0.2–1.2)
Total Protein: 6.8 g/dL (ref 6.1–8.1)

## 2020-09-06 LAB — EXTRA LAV TOP TUBE

## 2020-10-04 ENCOUNTER — Other Ambulatory Visit: Payer: Self-pay | Admitting: Family Medicine

## 2020-10-17 ENCOUNTER — Encounter: Payer: Self-pay | Admitting: Family Medicine

## 2020-10-17 ENCOUNTER — Other Ambulatory Visit: Payer: Self-pay

## 2020-10-17 ENCOUNTER — Telehealth (INDEPENDENT_AMBULATORY_CARE_PROVIDER_SITE_OTHER): Payer: 59 | Admitting: Family Medicine

## 2020-10-17 DIAGNOSIS — F3341 Major depressive disorder, recurrent, in partial remission: Secondary | ICD-10-CM

## 2020-10-17 MED ORDER — VENLAFAXINE HCL ER 75 MG PO CP24
75.0000 mg | ORAL_CAPSULE | Freq: Every day | ORAL | 2 refills | Status: DC
Start: 2020-10-17 — End: 2020-12-31

## 2020-10-17 NOTE — Progress Notes (Signed)
Chief Complaint  Patient presents with  . Follow-up    Subjective Alyssa Warner presents for f/u anxiety/depression. Due to COVID-19 pandemic, we are interacting via telephone. I verified patient's ID using 2 identifiers. Patient agreed to proceed with visit via this method. Patient is at home, I am at office. Patient and I are present for visit.   Pt is currently being treated with Effexor XR 75 mg.d.  Reports marked improvement since treatment. Having some dry mouth and constipation issues.  No thoughts of harming self or others. No self-medication with alcohol, prescription drugs or illicit drugs. Pt is not following with a counselor/psychologist.  Past Medical History:  Diagnosis Date  . Depression   . Eczema   . Heart palpitations    Allergies as of 10/17/2020   No Known Allergies     Medication List       Accurate as of October 17, 2020  8:52 AM. If you have any questions, ask your nurse or doctor.        atorvastatin 20 MG tablet Commonly known as: LIPITOR Take 1 tablet (20 mg total) by mouth daily at 6 PM.   calcium-vitamin D 500-200 MG-UNIT Tabs tablet Commonly known as: OSCAL WITH D Take by mouth.   venlafaxine XR 75 MG 24 hr capsule Commonly known as: EFFEXOR-XR Take 1 capsule (75 mg total) by mouth daily with breakfast.       Exam No conversational dyspnea Age appropriate judgment and insight Nml affect and mood  Assessment and Plan  Recurrent major depressive disorder, in partial remission (HCC)  Cont Effexor. Increase fiber. AE's not bothersome enough to make a change. F/u in 6 mo.  Total time: 12 min The patient voiced understanding and agreement to the plan.  Alyssa Roche [place], DO 10/17/20 8:52 AM

## 2020-11-16 ENCOUNTER — Other Ambulatory Visit: Payer: Self-pay | Admitting: Internal Medicine

## 2020-11-23 ENCOUNTER — Telehealth: Payer: Self-pay

## 2020-11-23 NOTE — Telephone Encounter (Signed)
I have sent an email to [employer] asking them to look into this billing concern for the patient. 

## 2020-12-29 ENCOUNTER — Other Ambulatory Visit: Payer: Self-pay | Admitting: Family Medicine

## 2021-09-10 ENCOUNTER — Other Ambulatory Visit: Payer: Self-pay | Admitting: Family Medicine

## 2021-09-10 ENCOUNTER — Other Ambulatory Visit: Payer: Self-pay | Admitting: Internal Medicine

## 2022-03-03 ENCOUNTER — Encounter: Payer: Self-pay | Admitting: Family Medicine

## 2022-04-08 ENCOUNTER — Ambulatory Visit: Payer: 59 | Admitting: Family Medicine

## 2022-04-30 ENCOUNTER — Ambulatory Visit (INDEPENDENT_AMBULATORY_CARE_PROVIDER_SITE_OTHER): Payer: 59 | Admitting: Family Medicine

## 2022-04-30 ENCOUNTER — Encounter: Payer: Self-pay | Admitting: Family Medicine

## 2022-04-30 VITALS — BP 120/80 | HR 60 | Temp 97.9°F | Ht 65.0 in | Wt 167.5 lb

## 2022-04-30 DIAGNOSIS — K146 Glossodynia: Secondary | ICD-10-CM

## 2022-04-30 DIAGNOSIS — F3341 Major depressive disorder, recurrent, in partial remission: Secondary | ICD-10-CM

## 2022-04-30 DIAGNOSIS — E785 Hyperlipidemia, unspecified: Secondary | ICD-10-CM | POA: Diagnosis not present

## 2022-04-30 DIAGNOSIS — R682 Dry mouth, unspecified: Secondary | ICD-10-CM | POA: Diagnosis not present

## 2022-04-30 LAB — COMPREHENSIVE METABOLIC PANEL
ALT: 26 U/L (ref 0–35)
AST: 22 U/L (ref 0–37)
Albumin: 4.5 g/dL (ref 3.5–5.2)
Alkaline Phosphatase: 86 U/L (ref 39–117)
BUN: 13 mg/dL (ref 6–23)
CO2: 33 mEq/L — ABNORMAL HIGH (ref 19–32)
Calcium: 9.3 mg/dL (ref 8.4–10.5)
Chloride: 100 mEq/L (ref 96–112)
Creatinine, Ser: 0.87 mg/dL (ref 0.40–1.20)
GFR: 73.24 mL/min (ref >60.00)
Glucose, Bld: 78 mg/dL (ref 70–99)
Potassium: 3.8 mEq/L (ref 3.5–5.1)
Sodium: 140 mEq/L (ref 135–145)
Total Bilirubin: 0.5 mg/dL (ref 0.2–1.2)
Total Protein: 7.1 g/dL (ref 6.0–8.3)

## 2022-04-30 LAB — CBC
HCT: 39.3 % (ref 36.0–46.0)
Hemoglobin: 12.9 g/dL (ref 12.0–15.0)
MCHC: 32.9 g/dL (ref 30.0–36.0)
MCV: 86.8 fl (ref 78.0–100.0)
Platelets: 270 10*3/uL (ref 150.0–400.0)
RBC: 4.53 Mil/uL (ref 3.87–5.11)
RDW: 13.5 % (ref 11.5–15.5)
WBC: 8.7 10*3/uL (ref 4.0–10.5)

## 2022-04-30 LAB — LIPID PANEL
Cholesterol: 170 mg/dL (ref 0–200)
HDL: 83.4 mg/dL (ref >39.00)
LDL Cholesterol: 68 mg/dL (ref 0–99)
NonHDL: 87.06
Total CHOL/HDL Ratio: 2
Triglycerides: 97 mg/dL (ref 0.0–149.0)
VLDL: 19.4 mg/dL (ref 0.0–40.0)

## 2022-04-30 NOTE — Patient Instructions (Signed)
Give us 2-3 business days to get the results of your labs back.   Keep the diet clean and stay active.  Let us know if you need anything. 

## 2022-04-30 NOTE — Progress Notes (Signed)
Chief Complaint  Patient presents with   Follow-up    Burning Tongue syndrome    Subjective DAMONIE PEARDON presents for f/u anxiety/depression.  Pt is currently being treated with Effexor XR 75 mg/d.  Reports doing well since treatment. No thoughts of harming self or others. No self-medication with alcohol, prescription drugs or illicit drugs. Pt is not following with a counselor/psychologist.  Hyperlipidemia Patient presents for dyslipidemia follow up. Currently being treated with Lipitor 20 mg/d and compliance with treatment thus far has been good. She denies myalgias. She is usually adhering to a healthy diet. Exercise: walking, pickleball The patient is not known to have coexisting coronary artery disease. No CP or SOB.   Burning/discomfort on tongue for several months.  No inciting factors.  She went to her dentist who thought it could be related to how she clenches down in her jaw and in.  She was told to switch her toothpaste brand which did not help.  She has an appointment with an ENT coming up.  She is wondering if she has any vitamin deficiency.  Past Medical History:  Diagnosis Date   Depression    Eczema    Heart palpitations    Allergies as of 04/30/2022   No Known Allergies      Medication List        Accurate as of Apr 30, 2022 11:44 AM. If you have any questions, ask your nurse or doctor.          STOP taking these medications    calcium-vitamin D 500-200 MG-UNIT Tabs tablet Commonly known as: OSCAL WITH D Stopped by: Shelda Pal, DO       TAKE these medications    atorvastatin 20 MG tablet Commonly known as: LIPITOR TAKE 1 TABLET DAILY AT 6PM   venlafaxine XR 75 MG 24 hr capsule Commonly known as: EFFEXOR-XR TAKE 1 CAPSULE DAILY WITH  BREAKFAST        Exam BP 120/80   Pulse 60   Temp 97.9 F (36.6 C) (Oral)   Ht 5\' 5"  (1.651 m)   Wt 167 lb 8 oz (76 kg)   SpO2 99%   BMI 27.87 kg/m  General:  well developed,  well nourished, in no apparent distress Heart: RRR, no lower extremity edema Mouth: MMD, no external lesions noted of the mucosa Lungs:  CTAB. No respiratory distress Psych: well oriented with normal range of affect and age-appropriate judgement/insight, alert and oriented x4.  Assessment and Plan  Recurrent major depressive disorder, in partial remission (HCC)  Dyslipidemia - Plan: Comprehensive metabolic panel, Lipid panel  Burning tongue - Plan: CBC, Zinc  Dry mouth - Plan: Sjogrens syndrome-A extractable nuclear antibody, Sjogrens syndrome-B extractable nuclear antibody  Chronic, stable.  Continue venlafaxine XR75 mg daily. Chronic, stable.  Continue Lipitor 20 mg daily.  Counseled on diet and exercise. Check above labs.  Keep appointment with ENT. As above. F/u in 6 mo for physical or as needed. The patient voiced understanding and agreement to the plan.  [place], DO 04/30/22 11:44 AM

## 2022-05-01 LAB — SJOGRENS SYNDROME-B EXTRACTABLE NUCLEAR ANTIBODY: SSB (La) (ENA) Antibody, IgG: <1 AI

## 2022-05-01 LAB — SJOGRENS SYNDROME-A EXTRACTABLE NUCLEAR ANTIBODY: SSA (Ro) (ENA) Antibody, IgG: <1 AI

## 2022-05-04 LAB — ZINC: Zinc: 70 ug/dL (ref 60–130)

## 2022-05-27 ENCOUNTER — Other Ambulatory Visit: Payer: Self-pay | Admitting: Family Medicine

## 2022-05-30 ENCOUNTER — Encounter: Payer: Self-pay | Admitting: Family Medicine

## 2022-10-20 ENCOUNTER — Other Ambulatory Visit: Payer: Self-pay | Admitting: Family Medicine

## 2022-10-20 MED ORDER — ATORVASTATIN CALCIUM 20 MG PO TABS: ORAL_TABLET | ORAL | 3 refills | Status: DC

## 2022-10-21 ENCOUNTER — Encounter: Payer: 59 | Admitting: Family Medicine

## 2022-11-05 ENCOUNTER — Ambulatory Visit (INDEPENDENT_AMBULATORY_CARE_PROVIDER_SITE_OTHER): Payer: 59 | Admitting: Family Medicine

## 2022-11-05 ENCOUNTER — Encounter: Payer: Self-pay | Admitting: Family Medicine

## 2022-11-05 ENCOUNTER — Encounter: Payer: 59 | Admitting: Family Medicine

## 2022-11-05 VITALS — BP 122/80 | HR 66 | Temp 98.2°F | Ht 65.0 in | Wt 168.2 lb

## 2022-11-05 DIAGNOSIS — Z23 Encounter for immunization: Secondary | ICD-10-CM | POA: Diagnosis not present

## 2022-11-05 DIAGNOSIS — M8588 Other specified disorders of bone density and structure, other site: Secondary | ICD-10-CM | POA: Diagnosis not present

## 2022-11-05 DIAGNOSIS — Z0000 Encounter for general adult medical examination without abnormal findings: Secondary | ICD-10-CM

## 2022-11-05 LAB — COMPREHENSIVE METABOLIC PANEL
ALT: 22 U/L (ref 0–35)
AST: 20 U/L (ref 0–37)
Albumin: 4.4 g/dL (ref 3.5–5.2)
Alkaline Phosphatase: 88 U/L (ref 39–117)
BUN: 15 mg/dL (ref 6–23)
CO2: 33 mEq/L — ABNORMAL HIGH (ref 19–32)
Calcium: 9.2 mg/dL (ref 8.4–10.5)
Chloride: 103 mEq/L (ref 96–112)
Creatinine, Ser: 0.75 mg/dL (ref 0.40–1.20)
GFR: 87.21 mL/min (ref >60.00)
Glucose, Bld: 97 mg/dL (ref 70–99)
Potassium: 4.8 mEq/L (ref 3.5–5.1)
Sodium: 141 mEq/L (ref 135–145)
Total Bilirubin: 0.4 mg/dL (ref 0.2–1.2)
Total Protein: 6.9 g/dL (ref 6.0–8.3)

## 2022-11-05 LAB — CBC
HCT: 40.5 % (ref 36.0–46.0)
Hemoglobin: 13.8 g/dL (ref 12.0–15.0)
MCHC: 34 g/dL (ref 30.0–36.0)
MCV: 86.4 fl (ref 78.0–100.0)
Platelets: 279 10*3/uL (ref 150.0–400.0)
RBC: 4.69 Mil/uL (ref 3.87–5.11)
RDW: 13.3 % (ref 11.5–15.5)
WBC: 5.8 10*3/uL (ref 4.0–10.5)

## 2022-11-05 LAB — LIPID PANEL
Cholesterol: 168 mg/dL (ref 0–200)
HDL: 78.3 mg/dL (ref >39.00)
LDL Cholesterol: 81 mg/dL (ref 0–99)
NonHDL: 90.05
Total CHOL/HDL Ratio: 2
Triglycerides: 46 mg/dL (ref 0.0–149.0)
VLDL: 9.2 mg/dL (ref 0.0–40.0)

## 2022-11-05 NOTE — Patient Instructions (Addendum)
Give [place] 2-3 business days to get the results of your labs back.   Keep the diet clean and stay active.  Please get me a copy of your advanced directive form at your convenience.   Send me a message with where you would like [place] to order the bone density scan.   Please schedule your pap smear.   Let us know if you need anything.

## 2022-11-05 NOTE — Progress Notes (Signed)
Chief Complaint  Patient presents with   Annual Exam     Well Woman Alyssa Warner is here for a complete physical.   Her last physical was >1 year ago.  Current diet: in general, a "healthy" diet. Current exercise: walking, pickle ball. Weight is stable and she denies fatigue out of ordinary. Seatbelt? Yes Advanced directive? Yes  Health Maintenance Pap/HPV- Due Mammogram- Yes Colon cancer screening-Yes Shingrix- Yes; needs 2nd one Tetanus- Yes Hep C screening- Yes HIV screening- Yes  Past Medical History:  Diagnosis Date   Depression    Eczema    Heart palpitations     Past Surgical History:  Procedure Laterality Date   ANKLE SURGERY     CHOLECYSTECTOMY     MET Test  10/2012   low risk study; peak VO2 94% predicted; ischemic myocardial dysfunction    TUBAL LIGATION     Medications  Current Outpatient Medications on File Prior to Visit  Medication Sig Dispense Refill   atorvastatin (LIPITOR) 20 MG tablet TAKE 1 TABLET DAILY AT 6PM 90 tablet 3   venlafaxine XR (EFFEXOR-XR) 75 MG 24 hr capsule TAKE 1 CAPSULE DAILY WITH  BREAKFAST 90 capsule 2   Allergies No Known Allergies  Review of Systems: Constitutional:  no unexpected weight changes Eye:  no recent significant change in vision Ear/Nose/Mouth/Throat:  Ears:  no recent change in hearing Nose/Mouth/Throat:  no complaints of nasal congestion, no sore throat Cardiovascular: no chest pain Respiratory:  no shortness of breath Gastrointestinal:  no abdominal pain, no change in bowel habits GU:  Female: negative for dysuria or pelvic pain Musculoskeletal/Extremities:  no pain of the joints Integumentary (Skin/Breast):  no abnormal skin lesions reported Neurologic:  no headaches Endocrine:  denies fatigue  Exam BP 122/80 (BP Location: Left Arm, Patient Position: Sitting, Cuff Size: Normal)   Pulse 66   Temp 98.2 F (36.8 C) (Oral)   Ht _0  (1.651 m)   Wt 168 lb 4 oz (76.3 kg)   SpO2 99%   BMI 28.00  kg/m  General:  well developed, well nourished, in no apparent distress Skin:  no significant moles, warts, or growths Head:  no masses, lesions, or tenderness Eyes:  pupils equal and round, sclera anicteric without injection Ears:  canals without lesions, TMs shiny without retraction, no obvious effusion, no erythema Nose:  nares patent, mucosa normal, and no drainage  Throat/Pharynx:  lips and gingiva without lesion; tongue and uvula midline; non-inflamed pharynx; no exudates or postnasal drainage Neck: neck supple without adenopathy, thyromegaly, or masses Lungs:  clear to auscultation, breath sounds equal bilaterally, no respiratory distress Cardio:  regular rate and rhythm, no LE edema Abdomen:  abdomen soft, nontender; bowel sounds normal; no masses or organomegaly Genital: Defer to GYN Musculoskeletal:  symmetrical muscle groups noted without atrophy or deformity Extremities:  no clubbing, cyanosis, or edema, no deformities, no skin discoloration Neuro:  gait normal; deep tendon reflexes normal and symmetric Psych: well oriented with normal range of affect and appropriate judgment/insight  Assessment and Plan  Well adult exam - Plan: CBC, Comprehensive metabolic panel, Lipid panel  Osteopenia of lumbar spine   Well 61 y.o. female. Counseled on diet and exercise. Shingrix 2/2 recommended, covid rec'd.  Hx of osteopenia, will order DEXA when she has a facility to send order to. Ca and Vit D.  Flu shot today.  Needs to get cerv cancer screening done, will find GYN in her new hometown.  Advanced directive form provided today.  Other orders as above. Follow up in 6 mo. The patient voiced understanding and agreement to the plan.  [place], DO 11/05/22 8:43 AM

## 2022-11-12 ENCOUNTER — Encounter: Payer: Self-pay | Admitting: Family Medicine

## 2022-11-12 ENCOUNTER — Other Ambulatory Visit: Payer: Self-pay | Admitting: Family Medicine

## 2022-11-12 DIAGNOSIS — M8588 Other specified disorders of bone density and structure, other site: Secondary | ICD-10-CM

## 2022-11-26 ENCOUNTER — Encounter: Payer: Self-pay | Admitting: Family Medicine

## 2023-03-10 ENCOUNTER — Other Ambulatory Visit: Payer: Self-pay | Admitting: Family Medicine

## 2023-03-10 ENCOUNTER — Encounter: Payer: Self-pay | Admitting: Family Medicine

## 2023-03-10 DIAGNOSIS — Z1211 Encounter for screening for malignant neoplasm of colon: Secondary | ICD-10-CM

## 2023-04-29 ENCOUNTER — Other Ambulatory Visit: Payer: Self-pay | Admitting: Family Medicine

## 2023-05-05 ENCOUNTER — Ambulatory Visit: Payer: 59 | Admitting: Family Medicine

## 2023-05-27 ENCOUNTER — Encounter: Payer: Self-pay | Admitting: Family Medicine

## 2023-05-28 LAB — COLOGUARD: COLOGUARD: NEGATIVE

## 2023-06-26 ENCOUNTER — Ambulatory Visit (INDEPENDENT_AMBULATORY_CARE_PROVIDER_SITE_OTHER): Payer: 59 | Admitting: Family Medicine

## 2023-06-26 ENCOUNTER — Encounter: Payer: Self-pay | Admitting: Family Medicine

## 2023-06-26 VITALS — BP 122/82 | HR 82 | Temp 98.3°F | Ht 65.0 in | Wt 167.5 lb

## 2023-06-26 DIAGNOSIS — F3341 Major depressive disorder, recurrent, in partial remission: Secondary | ICD-10-CM

## 2023-06-26 DIAGNOSIS — E785 Hyperlipidemia, unspecified: Secondary | ICD-10-CM

## 2023-06-26 NOTE — Progress Notes (Signed)
Chief Complaint  Patient presents with   Follow-up    Subjective: Hyperlipidemia Patient presents for Hyperlipidemia follow up. Currently taking Lipitor 20 mg/d and compliance with treatment thus far has been good. She denies myalgias. She is adhering to a healthy diet. Exercise: wt training, walking, pickle ball The patient is not known to have coexisting coronary artery disease. No Cp or SOB.   Depression Doing well on Effexor XR 75 mg/d. Compliant, no AE's. Mood is stable. No HI or SI. No self medication. She is not following with a counselor/therapist.   Past Medical History:  Diagnosis Date   Depression    Eczema    Heart palpitations     Objective: BP 122/82 (BP Location: Left Arm, Patient Position: Sitting, Cuff Size: Normal)   Pulse 82   Temp 98.3 F (36.8 C) (Oral)   Ht 5\' 5"  (1.651 m)   Wt 167 lb 8 oz (76 kg)   SpO2 98%   BMI 27.87 kg/m  General: Awake, appears stated age HEENT: MMM Heart: RRR, no LE edema, no bruits Lungs: CTAB, no rales, wheezes or rhonchi. No accessory muscle use Psych: Age appropriate judgment and insight, normal affect and mood  Assessment and Plan: Dyslipidemia  Recurrent major depressive disorder, in partial remission (HCC)  Chronic, stable. Cont Lipitor 20 mg/d. Counseled on diet/exercise. Ck CMP and Lipid panel. Chronic, stable. Cont Effexor XR 75 mg/d.  F/u in 6 mo. The patient voiced understanding and agreement to the plan.  Alyssa Roche [place], DO 06/26/23  8:04 AM

## 2023-06-26 NOTE — Patient Instructions (Addendum)
Keep the diet clean and stay active.  Give us 2-3 business days to get the results of your labs back.   Let us know if you need anything.  

## 2023-07-21 LAB — HM MAMMOGRAPHY

## 2023-07-22 ENCOUNTER — Encounter: Payer: Self-pay | Admitting: Family Medicine

## 2023-07-30 ENCOUNTER — Encounter: Payer: Self-pay | Admitting: Family Medicine

## 2023-10-08 ENCOUNTER — Other Ambulatory Visit: Payer: Self-pay | Admitting: Family Medicine

## 2023-11-29 ENCOUNTER — Other Ambulatory Visit: Payer: Self-pay | Admitting: Family Medicine

## 2023-12-23 ENCOUNTER — Encounter: Payer: Self-pay | Admitting: Family Medicine

## 2024-02-08 ENCOUNTER — Encounter: Payer: 59 | Admitting: Family Medicine

## 2024-03-05 ENCOUNTER — Encounter: Payer: Self-pay | Admitting: Family Medicine

## 2024-03-09 ENCOUNTER — Telehealth: Payer: Self-pay

## 2024-03-09 MED ORDER — VENLAFAXINE HCL ER 75 MG PO CP24: 75.0000 mg | ORAL_CAPSULE | Freq: Every day | ORAL | 0 refills | Status: DC

## 2024-03-09 NOTE — Telephone Encounter (Signed)
 Returned patients call and she was requesting a refill for Effexor.

## 2024-03-09 NOTE — Telephone Encounter (Signed)
 Copied from CRM [id number]. Topic: Clinical - Medication Question >> Mar 09, 2024  9:39 AM Kathryne Eriksson wrote: Reason for CRM: venlafaxine XR (EFFEXOR-XR) 75 MG 24 hr capsule >> Mar 09, 2024  9:39 AM Kathryne Eriksson wrote: Patient is requesting a call back in regards to her medication venlafaxine XR (EFFEXOR-XR) 75 MG 24 hr capsule. Patient call back number is [phone]

## 2024-03-09 NOTE — Addendum Note (Signed)
 Addended by: Cheron Every C on: 03/09/2024 10:18 AM   Modules accepted: Orders

## 2024-03-15 ENCOUNTER — Encounter: Payer: 59 | Admitting: Family Medicine

## 2024-03-16 ENCOUNTER — Ambulatory Visit (INDEPENDENT_AMBULATORY_CARE_PROVIDER_SITE_OTHER): Admitting: Family Medicine

## 2024-03-16 ENCOUNTER — Encounter: Payer: Self-pay | Admitting: Family Medicine

## 2024-03-16 VITALS — BP 128/84 | HR 70 | Temp 97.7°F | Ht 65.0 in | Wt 168.6 lb

## 2024-03-16 DIAGNOSIS — Z0000 Encounter for general adult medical examination without abnormal findings: Secondary | ICD-10-CM

## 2024-03-16 DIAGNOSIS — G4700 Insomnia, unspecified: Secondary | ICD-10-CM | POA: Diagnosis not present

## 2024-03-16 MED ORDER — ATORVASTATIN CALCIUM 20 MG PO TABS: 20.0000 mg | ORAL_TABLET | Freq: Every day | ORAL | 1 refills | Status: AC

## 2024-03-16 MED ORDER — VENLAFAXINE HCL ER 75 MG PO CP24: 75.0000 mg | ORAL_CAPSULE | Freq: Every day | ORAL | 1 refills | Status: AC

## 2024-03-16 MED ORDER — TRAZODONE HCL 50 MG PO TABS: 25.0000 mg | ORAL_TABLET | Freq: Every evening | ORAL | 3 refills | Status: DC | PRN

## 2024-03-16 NOTE — Patient Instructions (Addendum)
Give [place] 2-3 business days to get the results of your labs back.   Keep the diet clean and stay active.  Please get me a copy of your advanced directive form at your convenience.   Sleep Hygiene Tips: Do not watch TV or look at screens within 1 hour of going to bed. If you do, make sure there is a blue light filter (nighttime mode) involved. Try to go to bed around the same time every night. Wake up at the same time within 1 hour of regular time. Ex: If you wake up at 7 AM for work, do not sleep past 8 AM on days that you don't work. Do not drink alcohol before bedtime. Do not consume caffeine-containing beverages after noon or within 9 hours of intended bedtime. Get regular exercise/physical activity in your life, but not within 2 hours of planned bedtime. Do not take naps.  Do not eat within 2 hours of planned bedtime. Melatonin, 3-5 mg 30-60 minutes before planned bedtime may be helpful.  The bed should be for sleep or sex only. If after 20-30 minutes you are unable to fall asleep, get up and do something relaxing. Do this until you feel ready to go to sleep again.   Let us know if you need anything.

## 2024-03-16 NOTE — Progress Notes (Signed)
 Chief Complaint  Patient presents with   Annual Exam    Patient presents today for a physical exam   Quality Metric Gaps    Zoster, pap     Well Woman Alyssa Warner is here for a complete physical.   Her last physical was >1 year ago.  Current diet: in general, a "healthy" diet. Current exercise: walking; planning on joining a gym Weight is stable and she confirms fatigue out of ordinary. No LMP recorded. Patient is postmenopausal. Seatbelt? Yes Advanced directive? Yes  Health Maintenance Pap/HPV- Due Mammogram- Yes Colon cancer screening-Yes Shingrix- Yes Tetanus- Yes Hep C screening- Yes HIV screening- Yes  In October 2024, the patient fractured her patella after a fall.  Since that time she has had difficulty sleeping.  Part of it is due to the pain the part of it is due to racing thoughts.  She is taking Effexor XR 75 mg daily.  She reports compliance and no adverse effects.  She is not seeing a therapist right now.  She is not able to exercise as much as she was preinjury.  She tried melatonin without relief.  The Tylenol PM's make her groggy the next day.  She has never been on the prescription medication for sleep before.  She would prefer to avoid anything habit-forming.  Past Medical History:  Diagnosis Date   Depression    Eczema    Heart palpitations      Past Surgical History:  Procedure Laterality Date   ANKLE SURGERY     CHOLECYSTECTOMY     MET Test  10/2012   low risk study; peak VO2 94% predicted; ischemic myocardial dysfunction    TUBAL LIGATION      Medications  Current Outpatient Medications on File Prior to Visit  Medication Sig Dispense Refill   atorvastatin (LIPITOR) 20 MG tablet TAKE 1 TABLET DAILY 90 tablet 0   venlafaxine XR (EFFEXOR-XR) 75 MG 24 hr capsule Take 1 capsule (75 mg total) by mouth daily with breakfast. 30 capsule 0    Allergies No Known Allergies  Review of Systems: Constitutional:  no unexpected weight changes Eye:   no recent significant change in vision Ear/Nose/Mouth/Throat:  Ears:  no recent change in hearing Nose/Mouth/Throat:  no complaints of nasal congestion, no sore throat Cardiovascular: no chest pain Respiratory:  no shortness of breath Gastrointestinal:  no abdominal pain, no change in bowel habits GU:  Female: negative for dysuria or pelvic pain Musculoskeletal/Extremities:  no pain of the joints Integumentary (Skin/Breast):  no abnormal skin lesions reported Neurologic:  no headaches Endocrine:  denies fatigue  Exam BP 128/84   Pulse 70   Temp 97.7 F (36.5 C)   Ht 5\' 5"  (1.651 m)   Wt 168 lb 9.6 oz (76.5 kg)   SpO2 99%   BMI 28.06 kg/m  General:  well developed, well nourished, in no apparent distress Skin:  no significant moles, warts, or growths Head:  no masses, lesions, or tenderness Eyes:  pupils equal and round, sclera anicteric without injection Ears:  canals without lesions, TMs shiny without retraction, no obvious effusion, no erythema Nose:  nares patent, mucosa normal, and no drainage  Throat/Pharynx:  lips and gingiva without lesion; tongue and uvula midline; non-inflamed pharynx; no exudates or postnasal drainage Neck: neck supple without adenopathy, thyromegaly, or masses Lungs:  clear to auscultation, breath sounds equal bilaterally, no respiratory distress Cardio:  regular rate and rhythm, no LE edema Abdomen:  abdomen soft, nontender; bowel sounds  normal; no masses or organomegaly Genital: Defer to GYN Musculoskeletal:  symmetrical muscle groups noted without atrophy or deformity Extremities:  no clubbing, cyanosis, or edema, no deformities, no skin discoloration Neuro:  gait normal; deep tendon reflexes normal and symmetric Psych: well oriented with normal range of affect and appropriate judgment/insight  Assessment and Plan  Well adult exam - Plan: CBC, Comprehensive metabolic panel with GFR, Lipid panel  Insomnia, unspecified type - Plan: traZODone  (DESYREL) 50 MG tablet   Well 63 y.o. female. Counseled on diet and exercise. Advanced directive form requested today.  Shingrix dates rec'd.  Insomnia: Chronic, not controlled.  Start trazodone 25 to 50 mg nightly as needed.  Counseling recommended.  Sleep hygiene information provided. Other orders as above. Follow up in 6 mo. The patient voiced understanding and agreement to the plan.  Jilda Roche [place], DO 03/16/24 1:41 PM

## 2024-03-17 ENCOUNTER — Encounter: Payer: Self-pay | Admitting: Family Medicine

## 2024-03-17 LAB — COMPREHENSIVE METABOLIC PANEL WITH GFR
ALT: 23 U/L (ref 0–35)
AST: 25 U/L (ref 0–37)
Albumin: 4.4 g/dL (ref 3.5–5.2)
Alkaline Phosphatase: 91 U/L (ref 39–117)
BUN: 16 mg/dL (ref 6–23)
CO2: 29 meq/L (ref 19–32)
Calcium: 9.3 mg/dL (ref 8.4–10.5)
Chloride: 102 meq/L (ref 96–112)
Creatinine, Ser: 0.8 mg/dL (ref 0.40–1.20)
GFR: 79.94 mL/min (ref >60.00)
Glucose, Bld: 83 mg/dL (ref 70–99)
Potassium: 4.1 meq/L (ref 3.5–5.1)
Sodium: 139 meq/L (ref 135–145)
Total Bilirubin: 0.6 mg/dL (ref 0.2–1.2)
Total Protein: 7 g/dL (ref 6.0–8.3)

## 2024-03-17 LAB — CBC
HCT: 39.9 % (ref 36.0–46.0)
Hemoglobin: 13.3 g/dL (ref 12.0–15.0)
MCHC: 33.2 g/dL (ref 30.0–36.0)
MCV: 86.8 fl (ref 78.0–100.0)
Platelets: 275 10*3/uL (ref 150.0–400.0)
RBC: 4.59 Mil/uL (ref 3.87–5.11)
RDW: 13.6 % (ref 11.5–15.5)
WBC: 6.7 10*3/uL (ref 4.0–10.5)

## 2024-03-17 LAB — LIPID PANEL
Cholesterol: 172 mg/dL (ref 0–200)
HDL: 74.6 mg/dL (ref >39.00)
LDL Cholesterol: 85 mg/dL (ref 0–99)
NonHDL: 97.87
Total CHOL/HDL Ratio: 2
Triglycerides: 65 mg/dL (ref 0.0–149.0)
VLDL: 13 mg/dL (ref 0.0–40.0)

## 2024-04-10 ENCOUNTER — Other Ambulatory Visit: Payer: Self-pay | Admitting: Family Medicine

## 2024-04-10 DIAGNOSIS — G4700 Insomnia, unspecified: Secondary | ICD-10-CM

## 2024-04-27 ENCOUNTER — Ambulatory Visit: Admitting: Family Medicine

## 2024-06-01 ENCOUNTER — Ambulatory Visit: Admitting: Family Medicine
